# Patient Record
Sex: Female | Born: 2000
Health system: Southern US, Community
[De-identification: ages and names within clinical notes are randomized; demographics above are authoritative.]

## PROBLEM LIST (undated history)

## (undated) ENCOUNTER — Inpatient Hospital Stay (HOSPITAL_COMMUNITY): Payer: Self-pay

## (undated) ENCOUNTER — Emergency Department (HOSPITAL_COMMUNITY): Admission: EM | Payer: Medicaid Other | Source: Home / Self Care

## (undated) DIAGNOSIS — N83209 Unspecified ovarian cyst, unspecified side: Secondary | ICD-10-CM

## (undated) DIAGNOSIS — Z789 Other specified health status: Secondary | ICD-10-CM

## (undated) HISTORY — PX: NO PAST SURGERIES: SHX2092

---
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
## 2018-10-03 ENCOUNTER — Other Ambulatory Visit: Payer: Self-pay

## 2018-10-03 ENCOUNTER — Emergency Department (HOSPITAL_BASED_OUTPATIENT_CLINIC_OR_DEPARTMENT_OTHER)
Admission: EM | Admit: 2018-10-03 | Discharge: 2018-10-03 | Disposition: A | Discharge status: Home / Self Care | Payer: Medicaid Other | Attending: Emergency Medicine | Admitting: Emergency Medicine

## 2018-10-03 ENCOUNTER — Encounter (HOSPITAL_BASED_OUTPATIENT_CLINIC_OR_DEPARTMENT_OTHER): Payer: Self-pay

## 2018-10-03 DIAGNOSIS — R112 Nausea with vomiting, unspecified: Secondary | ICD-10-CM | POA: Diagnosis present

## 2018-10-03 DIAGNOSIS — R1084 Generalized abdominal pain: Secondary | ICD-10-CM | POA: Diagnosis not present

## 2018-10-03 DIAGNOSIS — R197 Diarrhea, unspecified: Secondary | ICD-10-CM | POA: Diagnosis not present

## 2018-10-03 MED ORDER — DICYCLOMINE HCL 10 MG PO CAPS
10.0000 mg | ORAL_CAPSULE | Freq: Once | ORAL | Status: AC
  Administered 2018-10-03: 10 mg via ORAL
  Filled 2018-10-03: qty 1

## 2018-10-03 MED ORDER — DIPHENHYDRAMINE HCL 50 MG/ML IJ SOLN
25.0000 mg | Freq: Once | INTRAMUSCULAR | Status: AC
  Administered 2018-10-03: 25 mg via INTRAMUSCULAR
  Filled 2018-10-03: qty 1

## 2018-10-03 MED ORDER — PROMETHAZINE HCL 25 MG RE SUPP: 25.0000 mg | Freq: Four times a day (QID) | RECTAL | 0 refills | Status: DC | PRN

## 2018-10-03 MED ORDER — PROCHLORPERAZINE EDISYLATE 10 MG/2ML IJ SOLN
10.0000 mg | Freq: Once | INTRAMUSCULAR | Status: AC
Start: 2018-10-03 — End: 2018-10-03
  Administered 2018-10-03: 10 mg via INTRAMUSCULAR
  Filled 2018-10-03: qty 2

## 2018-10-03 MED ORDER — DICYCLOMINE HCL 20 MG PO TABS: 20.0000 mg | ORAL_TABLET | Freq: Two times a day (BID) | ORAL | 0 refills | Status: DC

## 2018-10-03 MED ORDER — ALUM & MAG HYDROXIDE-SIMETH 200-200-20 MG/5ML PO SUSP
15.0000 mL | Freq: Once | ORAL | Status: AC
  Administered 2018-10-03: 15 mL via ORAL
  Filled 2018-10-03: qty 30

## 2018-10-03 NOTE — ED Provider Notes (Signed)
[facility] EMERGENCY DEPARTMENT Provider Note   CSN: [id number] Arrival date & time: 10/03/18  2128     History   Chief Complaint Chief Complaint  Patient presents with  . Abdominal Pain    HPI Veronica Pittman is a 18 y.o. female.  17 yo F with a chief complaint of nausea vomiting diarrhea and abdominal cramping.  This been going on for the past couple days.  The patient's boyfriend had a similar illness.  She is continued to have abdominal cramping and vomiting today.  She went to the emergency room this morning was diagnosed with a likely viral illness and was discharged home.  Since then she is continued to have the abdominal cramping and vomiting.  She does not feel that she is been able to keep much down.  Describes the pain is all over the abdomen.  Nothing seems to make it better or worse.  The history is provided by the patient.  Abdominal Pain   This is a new problem. The current episode started yesterday. The problem occurs constantly. The problem has not changed since onset.The pain is associated with an unknown factor. The pain is located in the generalized abdominal region. The quality of the pain is cramping. The pain is at a severity of 10/10. The pain is severe. Associated symptoms include fever (subjective), diarrhea, nausea and vomiting. Pertinent negatives include dysuria, headaches, arthralgias and myalgias. Nothing aggravates the symptoms. Nothing relieves the symptoms.    History reviewed. No pertinent past medical history.  There are no active problems to display for this patient.   History reviewed. No pertinent surgical history.   OB History    Gravida  1   Para      Term      Preterm      AB      Living        SAB      TAB      Ectopic      Multiple      Live Births               Home Medications    Prior to Admission medications   Medication Sig Start Date End Date Taking? Authorizing Provider  dicyclomine  (BENTYL) 20 MG tablet Take 1 tablet (20 mg total) by mouth 2 (two) times daily. 10/03/18   Melene Plan, DO  promethazine (PHENERGAN) 25 MG suppository Place 1 suppository (25 mg total) rectally every 6 (six) hours as needed for nausea or vomiting. 10/03/18   Melene Plan, DO    Family History No family history on file.  Social History Social History   Tobacco Use  . Smoking status: Never Smoker  . Smokeless tobacco: Never Used  Substance Use Topics  . Alcohol use: No  . Drug use: Never     Allergies   Patient has no known allergies.   Review of Systems Review of Systems  Constitutional: Positive for fever (subjective). Negative for chills.  HENT: Negative for congestion and rhinorrhea.   Eyes: Negative for redness and visual disturbance.  Respiratory: Negative for shortness of breath and wheezing.   Cardiovascular: Negative for chest pain and palpitations.  Gastrointestinal: Positive for abdominal pain, diarrhea, nausea and vomiting.  Genitourinary: Negative for dysuria and urgency.  Musculoskeletal: Negative for arthralgias and myalgias.  Skin: Negative for pallor and wound.  Neurological: Negative for dizziness and headaches.     Physical Exam Updated Vital Signs BP 127/89 (BP Location: Left Arm)  Pulse (!) 55   Temp 98.5 F (36.9 C) (Oral)   Resp 18   Ht 5\' 2"  (1.575 m)   Wt 48.1 kg   LMP 09/05/2018   SpO2 100%   Breastfeeding? Unknown   BMI 19.39 kg/m   Physical Exam  Constitutional: She is oriented to person, place, and time. She appears well-developed and well-nourished. No distress.  HENT:  Head: Normocephalic and atraumatic.  Eyes: Pupils are equal, round, and reactive to light. EOM are normal.  Neck: Normal range of motion. Neck supple.  Cardiovascular: Normal rate and regular rhythm. Exam reveals no gallop and no friction rub.  No murmur heard. Pulmonary/Chest: Effort normal. She has no wheezes. She has no rales.  Abdominal: Soft. She exhibits no  distension. There is tenderness (mild diffuse).  Musculoskeletal: She exhibits no edema or tenderness.  Neurological: She is alert and oriented to person, place, and time.  Skin: Skin is warm and dry. She is not diaphoretic.  Psychiatric: She has a normal mood and affect. Her behavior is normal.  Nursing note and vitals reviewed.    ED Treatments / Results  Labs (all labs ordered are listed, but only abnormal results are displayed) Labs Reviewed - No data to display  EKG None  Radiology No results found.  Procedures Procedures (including critical care time)  Medications Ordered in ED Medications  prochlorperazine (COMPAZINE) injection 10 mg (10 mg Intramuscular Given 10/03/18 2202)  diphenhydrAMINE (BENADRYL) injection 25 mg (25 mg Intramuscular Given 10/03/18 2202)  dicyclomine (BENTYL) capsule 10 mg (10 mg Oral Given 10/03/18 2201)  alum & mag hydroxide-simeth (MAALOX/MYLANTA) 200-200-20 MG/5ML suspension 15 mL (15 mLs Oral Given 10/03/18 2201)     Initial Impression / Assessment and Plan / ED Course  I have reviewed the triage vital signs and the nursing notes.  Pertinent labs & imaging results that were available during my care of the patient were reviewed by me and considered in my medical decision making (see chart for details).     17 yo F with a chief complaint of diffuse abdominal pain.  She has a very benign abdominal exam with mild tenderness everywhere.  She was seen earlier today and I agree with the earlier assessment by that physician seems most likely to be a viral illness especially with a boyfriend having a similar issue.  I do not feel that repeat lab work is required as she is young and healthy with no significant medical issues I feel they are unlikely to be changed, also did not feel that imaging would be beneficial with diffuse pain.  I will treat her symptomatically.  We will have her follow-up with her family doctor.  10:46 PM:  I have discussed the  diagnosis/risks/treatment options with the patient and family and believe the pt to be eligible for discharge home to follow-up with PCP. We also discussed returning to the ED immediately if new or worsening sx occur. We discussed the sx which are most concerning (e.g., sudden worsening pain, fever, inability to tolerate by mouth) that necessitate immediate return. Medications administered to the patient during their visit and any new prescriptions provided to the patient are listed below.  Medications given during this visit Medications  prochlorperazine (COMPAZINE) injection 10 mg (10 mg Intramuscular Given 10/03/18 2202)  diphenhydrAMINE (BENADRYL) injection 25 mg (25 mg Intramuscular Given 10/03/18 2202)  dicyclomine (BENTYL) capsule 10 mg (10 mg Oral Given 10/03/18 2201)  alum & mag hydroxide-simeth (MAALOX/MYLANTA) 200-200-20 MG/5ML suspension 15 mL (15 mLs  Oral Given 10/03/18 2201)      The patient appears reasonably screen and/or stabilized for discharge and I doubt any other medical condition or other [facility] requiring further screening, evaluation, or treatment in the ED at this time prior to discharge.    Final Clinical Impressions(s) / ED Diagnoses   Final diagnoses:  Generalized abdominal pain    ED Discharge Orders         Ordered    promethazine (PHENERGAN) 25 MG suppository  Every 6 hours PRN     10/03/18 2243    dicyclomine (BENTYL) 20 MG tablet  2 times daily     10/03/18 2243           Melene Plan, DO 10/03/18 2246

## 2018-10-03 NOTE — ED Triage Notes (Signed)
C/o abd pain, n/v/d x 2 days-[facility]-rx zofran with no relief and increase in pain-NAD-steady gait

## 2018-10-03 NOTE — Discharge Instructions (Signed)
Follow up with your PCP.  Return for inability to eat or drink, persistent fever >5 days, sudden worsening abdominal pain. ° °

## 2018-10-03 NOTE — ED Notes (Signed)
ED Provider at bedside. 

## 2018-10-09 ENCOUNTER — Emergency Department (HOSPITAL_BASED_OUTPATIENT_CLINIC_OR_DEPARTMENT_OTHER)
Admission: EM | Admit: 2018-10-09 | Discharge: 2018-10-09 | Disposition: A | Discharge status: Home / Self Care | Payer: Medicaid Other | Attending: Emergency Medicine | Admitting: Emergency Medicine

## 2018-10-09 ENCOUNTER — Encounter (HOSPITAL_BASED_OUTPATIENT_CLINIC_OR_DEPARTMENT_OTHER): Payer: Self-pay | Admitting: Emergency Medicine

## 2018-10-09 ENCOUNTER — Emergency Department (HOSPITAL_BASED_OUTPATIENT_CLINIC_OR_DEPARTMENT_OTHER): Payer: Medicaid Other

## 2018-10-09 ENCOUNTER — Other Ambulatory Visit: Payer: Self-pay

## 2018-10-09 DIAGNOSIS — B373 Candidiasis of vulva and vagina: Secondary | ICD-10-CM | POA: Diagnosis not present

## 2018-10-09 DIAGNOSIS — B3731 Acute candidiasis of vulva and vagina: Secondary | ICD-10-CM

## 2018-10-09 DIAGNOSIS — N83292 Other ovarian cyst, left side: Secondary | ICD-10-CM | POA: Insufficient documentation

## 2018-10-09 DIAGNOSIS — Z79899 Other long term (current) drug therapy: Secondary | ICD-10-CM | POA: Insufficient documentation

## 2018-10-09 DIAGNOSIS — N83202 Unspecified ovarian cyst, left side: Secondary | ICD-10-CM

## 2018-10-09 DIAGNOSIS — R1032 Left lower quadrant pain: Secondary | ICD-10-CM | POA: Diagnosis present

## 2018-10-09 LAB — PREGNANCY, URINE: Preg Test, Ur: NEGATIVE

## 2018-10-09 LAB — URINALYSIS, ROUTINE W REFLEX MICROSCOPIC
Glucose, UA: NEGATIVE mg/dL
HGB URINE DIPSTICK: NEGATIVE
Ketones, ur: 40 mg/dL — AB
Leukocytes, UA: NEGATIVE
Nitrite: NEGATIVE
PROTEIN: NEGATIVE mg/dL
Specific Gravity, Urine: 1.025 (ref 1.005–1.030)
pH: 6 (ref 5.0–8.0)

## 2018-10-09 LAB — COMPREHENSIVE METABOLIC PANEL
ALBUMIN: 4.7 g/dL (ref 3.5–5.0)
ALT: 17 U/L (ref 0–44)
AST: 29 U/L (ref 15–41)
Alkaline Phosphatase: 52 U/L (ref 38–126)
Anion gap: 16 — ABNORMAL HIGH (ref 5–15)
BUN: 13 mg/dL (ref 6–20)
CALCIUM: 9.2 mg/dL (ref 8.9–10.3)
CO2: 25 mmol/L (ref 22–32)
Chloride: 92 mmol/L — ABNORMAL LOW (ref 98–111)
Creatinine, Ser: 0.98 mg/dL (ref 0.44–1.00)
GFR calc Af Amer: >60 mL/min (ref >60)
GFR calc non Af Amer: >60 mL/min (ref >60)
GLUCOSE: 91 mg/dL (ref 70–99)
Potassium: 3 mmol/L — ABNORMAL LOW (ref 3.5–5.1)
Sodium: 133 mmol/L — ABNORMAL LOW (ref 135–145)
TOTAL PROTEIN: 8.4 g/dL — AB (ref 6.5–8.1)
Total Bilirubin: 1.9 mg/dL — ABNORMAL HIGH (ref 0.3–1.2)

## 2018-10-09 LAB — WET PREP, GENITAL
CLUE CELLS WET PREP: NONE SEEN
SPERM: NONE SEEN
TRICH WET PREP: NONE SEEN

## 2018-10-09 LAB — CBC
HCT: 51.6 % — ABNORMAL HIGH (ref 36.0–46.0)
Hemoglobin: 17.5 g/dL — ABNORMAL HIGH (ref 12.0–15.0)
MCH: 28.9 pg (ref 26.0–34.0)
MCHC: 33.9 g/dL (ref 30.0–36.0)
MCV: 85.1 fL (ref 80.0–100.0)
Platelets: 264 10*3/uL (ref 150–400)
RBC: 6.06 MIL/uL — AB (ref 3.87–5.11)
RDW: 12.7 % (ref 11.5–15.5)
WBC: 4.3 10*3/uL (ref 4.0–10.5)
nRBC: 0 % (ref 0.0–0.2)

## 2018-10-09 LAB — LIPASE, BLOOD: LIPASE: 44 U/L (ref 11–51)

## 2018-10-09 MED ORDER — FLUCONAZOLE 150 MG PO TABS: 150.0000 mg | ORAL_TABLET | Freq: Every day | ORAL | 0 refills | Status: AC

## 2018-10-09 MED ORDER — IOPAMIDOL (ISOVUE-300) INJECTION 61%
100.0000 mL | Freq: Once | INTRAVENOUS | Status: AC | PRN
  Administered 2018-10-09: 100 mL via INTRAVENOUS

## 2018-10-09 MED ORDER — SODIUM CHLORIDE 0.9 % IV BOLUS
1000.0000 mL | Freq: Once | INTRAVENOUS | Status: AC
  Administered 2018-10-09: 1000 mL via INTRAVENOUS

## 2018-10-09 MED ORDER — DICYCLOMINE HCL 10 MG PO CAPS
10.0000 mg | ORAL_CAPSULE | Freq: Once | ORAL | Status: AC
  Administered 2018-10-09: 10 mg via ORAL
  Filled 2018-10-09: qty 1

## 2018-10-09 MED ORDER — KETOROLAC TROMETHAMINE 15 MG/ML IJ SOLN
15.0000 mg | Freq: Once | INTRAMUSCULAR | Status: AC
  Administered 2018-10-09: 15 mg via INTRAVENOUS
  Filled 2018-10-09: qty 1

## 2018-10-09 MED ORDER — LIDOCAINE HCL (PF) 1 % IJ SOLN
INTRAMUSCULAR | Status: AC
  Administered 2018-10-09: 1.2 mL
  Filled 2018-10-09: qty 5

## 2018-10-09 MED ORDER — ONDANSETRON HCL 4 MG/2ML IJ SOLN
4.0000 mg | Freq: Once | INTRAMUSCULAR | Status: AC
  Administered 2018-10-09: 4 mg via INTRAVENOUS
  Filled 2018-10-09: qty 2

## 2018-10-09 MED ORDER — AZITHROMYCIN 250 MG PO TABS
1000.0000 mg | ORAL_TABLET | Freq: Once | ORAL | Status: AC
  Administered 2018-10-09: 1000 mg via ORAL
  Filled 2018-10-09: qty 4

## 2018-10-09 MED ORDER — POTASSIUM CHLORIDE CRYS ER 20 MEQ PO TBCR
40.0000 meq | EXTENDED_RELEASE_TABLET | Freq: Once | ORAL | Status: AC
  Administered 2018-10-09: 40 meq via ORAL
  Filled 2018-10-09: qty 2

## 2018-10-09 MED ORDER — CEFTRIAXONE SODIUM 250 MG IJ SOLR
250.0000 mg | Freq: Once | INTRAMUSCULAR | Status: AC
  Administered 2018-10-09: 250 mg via INTRAMUSCULAR
  Filled 2018-10-09: qty 250

## 2018-10-09 NOTE — ED Triage Notes (Signed)
LLQ abdominal pain with N/V/D for one week.  Unknown fever but "felt hot"  No chills.

## 2018-10-09 NOTE — ED Provider Notes (Signed)
[facility] EMERGENCY DEPARTMENT Provider Note   CSN: [id number] Arrival date & time: 10/09/18  4098     History   Chief Complaint Chief Complaint  Patient presents with  . Abdominal Pain    HPI Veronica Pittman is a 17 y.o. female.  HPI   Pt is a 17 y/o female with no significant past medical history who presents to the ED today c/o LLQ abd pain that began about 1.5 weeks ago. She states pain has been constant since onset is has been worsening. Rate pain 6/10.  She reports associated nausea, vomiting (>5x/day), and diarrhea (2-3 times per day). She reports subjective fevers, chills, sweats.  Denies dysuria, frequency, urgency, hematuria, vaginal discharge, vaginal bleeding.   Denies ETOH or drug use.   History reviewed. No pertinent past medical history.  There are no active problems to display for this patient.   History reviewed. No pertinent surgical history.   OB History    Gravida  1   Para      Term      Preterm      AB      Living        SAB      TAB      Ectopic      Multiple      Live Births               Home Medications    Prior to Admission medications   Medication Sig Start Date End Date Taking? Authorizing Provider  dicyclomine (BENTYL) 20 MG tablet Take 1 tablet (20 mg total) by mouth 2 (two) times daily. 10/03/18   Melene Plan, DO  fluconazole (DIFLUCAN) 150 MG tablet Take 1 tablet (150 mg total) by mouth daily for 2 days. Take 1 tablet by mouth today.  If you are continuing to have symptoms in the next 72 hours then repeat the dose and take the second tablet. 10/09/18 10/11/18  Kateland Leisinger S, PA-C  promethazine (PHENERGAN) 25 MG suppository Place 1 suppository (25 mg total) rectally every 6 (six) hours as needed for nausea or vomiting. 10/03/18   Melene Plan, DO    Family History No family history on file.  Social History Social History   Tobacco Use  . Smoking status: Never Smoker  . Smokeless tobacco: Never  Used  Substance Use Topics  . Alcohol use: No  . Drug use: Never     Allergies   Patient has no known allergies.   Review of Systems Review of Systems  Constitutional: Negative for chills and fever.  HENT: Negative for congestion, rhinorrhea and sore throat.   Eyes: Negative for visual disturbance.  Respiratory: Negative for cough and shortness of breath.   Cardiovascular: Negative for chest pain.  Gastrointestinal: Positive for abdominal pain, diarrhea, nausea and vomiting. Negative for blood in stool and constipation.  Genitourinary: Negative for dysuria, flank pain, frequency, hematuria and urgency.  Musculoskeletal: Negative for back pain.  Skin: Negative for rash.  Neurological: Negative for headaches.   Physical Exam Updated Vital Signs BP 111/76 (BP Location: Right Arm)   Pulse 72   Temp 98.3 F (36.8 C) (Oral)   Resp 16   Ht 5\' 2"  (1.575 m)   Wt 48 kg   LMP 09/05/2018   SpO2 100%   BMI 19.35 kg/m   Physical Exam  Constitutional: She appears well-developed and well-nourished. No distress.  HENT:  Head: Normocephalic and atraumatic.  Eyes: Conjunctivae are normal. No scleral icterus.  Neck: Neck supple.  Cardiovascular: Normal rate and regular rhythm.  No murmur heard. Pulmonary/Chest: Effort normal and breath sounds normal. No respiratory distress.  Abdominal: Soft.  LUQ and LLQ abd TTP. No rebound, rigidity, or guarding. No CVA TTP.   Genitourinary:  Genitourinary Comments: Exam performed by Karrie Meres,  exam chaperoned Date: 10/09/2018 Pelvic exam: normal external genitalia without evidence of trauma. VULVA: normal appearing vulva with no masses, tenderness or lesion. VAGINA: normal appearing vagina with normal color and discharge, no lesions. CERVIX: normal appearing cervix without lesions, cervical motion tenderness absent, cervical os closed with out purulent discharge; copious vaginal discharge present that is yellow/white.  Wet prep and DNA  probe for chlamydia and GC obtained.   ADNEXA: normal adnexa in size, nontender and no masses UTERUS: uterus is normal size, shape, consistency and nontender.   Musculoskeletal: She exhibits no edema.  Neurological: She is alert.  Skin: Skin is warm and dry. Capillary refill takes less than 2 seconds.  Psychiatric: She has a normal mood and affect.  Nursing note and vitals reviewed.  ED Treatments / Results  Labs (all labs ordered are listed, but only abnormal results are displayed) Labs Reviewed  WET PREP, GENITAL - Abnormal; Notable for the following components:      Result Value   Yeast Wet Prep HPF POC PRESENT (*)    WBC, Wet Prep HPF POC MANY (*)    All other components within normal limits  COMPREHENSIVE METABOLIC PANEL - Abnormal; Notable for the following components:   Sodium 133 (*)    Potassium 3.0 (*)    Chloride 92 (*)    Total Protein 8.4 (*)    Total Bilirubin 1.9 (*)    Anion gap 16 (*)    All other components within normal limits  CBC - Abnormal; Notable for the following components:   RBC 6.06 (*)    Hemoglobin 17.5 (*)    HCT 51.6 (*)    All other components within normal limits  URINALYSIS, ROUTINE W REFLEX MICROSCOPIC - Abnormal; Notable for the following components:   Bilirubin Urine MODERATE (*)    Ketones, ur 40 (*)    All other components within normal limits  LIPASE, BLOOD  PREGNANCY, URINE  GC/CHLAMYDIA PROBE AMP ([place]) NOT AT [facility]    EKG None  Radiology US Transvaginal Non-ob  Result Date: 10/09/2018 CLINICAL DATA:  Left lower quadrant pain for 1 week. CT demonstrating ruptured ovarian cyst. EXAM: TRANSABDOMINAL AND TRANSVAGINAL ULTRASOUND OF PELVIS DOPPLER ULTRASOUND OF OVARIES TECHNIQUE: Both transabdominal and transvaginal ultrasound examinations of the pelvis were performed. Transabdominal technique was performed for global imaging of the pelvis including uterus, ovaries, adnexal regions, and pelvic cul-de-sac. It was necessary to  proceed with endovaginal exam following the transabdominal exam to visualize the uterus, ovaries, and adnexa. Color and duplex Doppler ultrasound was utilized to evaluate blood flow to the ovaries. COMPARISON:  CT of earlier today FINDINGS: Uterus Measurements: 8.2 x 4.2 x 4.8 cm = volume: 87 mL. No fibroids or other mass visualized. Endometrium Thickness: Normal, 9 mm.  No focal abnormality visualized. Right ovary Measurements: 3.9 x 3.4 x 3.1 cm = volume: 21 mL. Normal appearance/no adnexal mass. Left ovary Measurements: 6.3 x 3.2 x 4.1 cm = volume: 42 mL. 3.6 x 2.6 x 3.3 cm avascular lesion within is consistent with a hemorrhagic cyst. Pulsed Doppler evaluation of both ovaries demonstrates normal low-resistance arterial and venous waveforms. Other findings Small amount of free pelvic fluid. IMPRESSION:  1. Left ovarian hemorrhagic cyst, as on CT. 2. No evidence of ovarian or adnexal torsion. Electronically Signed   By: Jeronimo Greaves M.D.   On: 10/09/2018 17:31   US Pelvis Complete  Result Date: 10/09/2018 CLINICAL DATA:  Left lower quadrant pain for 1 week. CT demonstrating ruptured ovarian cyst. EXAM: TRANSABDOMINAL AND TRANSVAGINAL ULTRASOUND OF PELVIS DOPPLER ULTRASOUND OF OVARIES TECHNIQUE: Both transabdominal and transvaginal ultrasound examinations of the pelvis were performed. Transabdominal technique was performed for global imaging of the pelvis including uterus, ovaries, adnexal regions, and pelvic cul-de-sac. It was necessary to proceed with endovaginal exam following the transabdominal exam to visualize the uterus, ovaries, and adnexa. Color and duplex Doppler ultrasound was utilized to evaluate blood flow to the ovaries. COMPARISON:  CT of earlier today FINDINGS: Uterus Measurements: 8.2 x 4.2 x 4.8 cm = volume: 87 mL. No fibroids or other mass visualized. Endometrium Thickness: Normal, 9 mm.  No focal abnormality visualized. Right ovary Measurements: 3.9 x 3.4 x 3.1 cm = volume: 21 mL. Normal  appearance/no adnexal mass. Left ovary Measurements: 6.3 x 3.2 x 4.1 cm = volume: 42 mL. 3.6 x 2.6 x 3.3 cm avascular lesion within is consistent with a hemorrhagic cyst. Pulsed Doppler evaluation of both ovaries demonstrates normal low-resistance arterial and venous waveforms. Other findings Small amount of free pelvic fluid. IMPRESSION: 1. Left ovarian hemorrhagic cyst, as on CT. 2. No evidence of ovarian or adnexal torsion. Electronically Signed   By: Jeronimo Greaves M.D.   On: 10/09/2018 17:31   Ct Abdomen Pelvis W Contrast  Result Date: 10/09/2018 CLINICAL DATA:  LEFT lower quadrant pain, nausea, vomiting and diarrhea for 1 week. EXAM: CT ABDOMEN AND PELVIS WITH CONTRAST TECHNIQUE: Multidetector CT imaging of the abdomen and pelvis was performed using the standard protocol following bolus administration of intravenous contrast. CONTRAST:  ISOVUE-300 IOPAMIDOL (ISOVUE-300) INJECTION 61% COMPARISON:  None. FINDINGS: LOWER CHEST: Lung bases are clear. Included heart size is normal. No pericardial effusion. HEPATOBILIARY: Liver and gallbladder are normal. PANCREAS: Normal. SPLEEN: Normal. ADRENALS/URINARY TRACT: Kidneys are orthotopic, demonstrating symmetric enhancement. No nephrolithiasis, hydronephrosis or solid renal masses. The unopacified ureters are normal in course and caliber. Urinary bladder is partially distended and unremarkable. Normal adrenal glands. STOMACH/BOWEL: Mild fluid and debris distended stomach. The small and large bowel are normal in course and caliber without inflammatory changes. Normal appendix. VASCULAR/LYMPHATIC: Aortoiliac vessels are normal in course and caliber. No lymphadenopathy by CT size criteria. REPRODUCTIVE: 3.5 cm anechoic LEFT adnexal cyst with capsular density and crescentic surrounding density measuring to 7 mm in transaxial dimension. OTHER: Small volume free fluid in the pelvis. No focal fluid collections or intraperitoneal free air. MUSCULOSKELETAL: Nonacute.  IMPRESSION: 1. 3.5 cm ruptured LEFT adnexal cyst with small amount of surrounding blood products. Electronically Signed   By: Awilda Metro M.D.   On: 10/09/2018 14:49   [place] Art/ven Flow Abd Pelv Doppler  Result Date: 10/09/2018 CLINICAL DATA:  Left lower quadrant pain for 1 week. CT demonstrating ruptured ovarian cyst. EXAM: TRANSABDOMINAL AND TRANSVAGINAL ULTRASOUND OF PELVIS DOPPLER ULTRASOUND OF OVARIES TECHNIQUE: Both transabdominal and transvaginal ultrasound examinations of the pelvis were performed. Transabdominal technique was performed for global imaging of the pelvis including uterus, ovaries, adnexal regions, and pelvic cul-de-sac. It was necessary to proceed with endovaginal exam following the transabdominal exam to visualize the uterus, ovaries, and adnexa. Color and duplex Doppler ultrasound was utilized to evaluate blood flow to the ovaries. COMPARISON:  CT of earlier today FINDINGS: Uterus  Measurements: 8.2 x 4.2 x 4.8 cm = volume: 87 mL. No fibroids or other mass visualized. Endometrium Thickness: Normal, 9 mm.  No focal abnormality visualized. Right ovary Measurements: 3.9 x 3.4 x 3.1 cm = volume: 21 mL. Normal appearance/no adnexal mass. Left ovary Measurements: 6.3 x 3.2 x 4.1 cm = volume: 42 mL. 3.6 x 2.6 x 3.3 cm avascular lesion within is consistent with a hemorrhagic cyst. Pulsed Doppler evaluation of both ovaries demonstrates normal low-resistance arterial and venous waveforms. Other findings Small amount of free pelvic fluid. IMPRESSION: 1. Left ovarian hemorrhagic cyst, as on CT. 2. No evidence of ovarian or adnexal torsion. Electronically Signed   By: Jeronimo Greaves M.D.   On: 10/09/2018 17:31    Procedures Procedures (including critical care time)  Medications Ordered in ED Medications  potassium chloride SA (K-DUR,KLOR-CON) CR tablet 40 mEq (40 mEq Oral Given 10/09/18 1146)  ondansetron (ZOFRAN) injection 4 mg (4 mg Intravenous Given 10/09/18 1146)  sodium chloride 0.9  % bolus 1,000 mL (0 mLs Intravenous Stopped 10/09/18 1354)  dicyclomine (BENTYL) capsule 10 mg (10 mg Oral Given 10/09/18 1146)  ketorolac (TORADOL) 15 MG/ML injection 15 mg (15 mg Intravenous Given 10/09/18 1303)  iopamidol (ISOVUE-300) 61 % injection 100 mL (100 mLs Intravenous Contrast Given 10/09/18 1422)  cefTRIAXone (ROCEPHIN) injection 250 mg (250 mg Intramuscular Given 10/09/18 1741)  azithromycin (ZITHROMAX) tablet 1,000 mg (1,000 mg Oral Given 10/09/18 1741)  lidocaine (PF) (XYLOCAINE) 1 % injection (1.2 mLs  Given 10/09/18 1741)     Initial Impression / Assessment and Plan / ED Course  I have reviewed the triage vital signs and the nursing notes.  Pertinent labs & imaging results that were available during my care of the patient were reviewed by me and considered in my medical decision making (see chart for details).     Final Clinical Impressions(s) / ED Diagnoses   Final diagnoses:  Hemorrhagic cyst of left ovary  Vaginal candidiasis   Pt presenting with LUQ and LLQ abd pain associated with NVD. VSS. Afebrile. Has some LUQ and LLQ TTP on exam. No rigidity, rebound or guarding.   Cbc without leukocytosis. hgb elevated, may be due to hemoconcentration in setting of dehydration from NVD. CMP with mild hyponatremia and hypokalemia. Normal kidney and liver function. Lipase WNL. UA with ketonuria and bilirubinuria, no nitrites or leukocytes to suggest urinary tract infection.  On reevaluation after antiemetics, Bentyl and Toradol, patient is crying and states that her pain is not resolved and is actually worse than when she arrived.  Will obtain imaging with CT scan of the abdomen and pelvis.  CT abd/pelvis with ruptured left adnexal cyst. After discussion of these findings with pt, she now admits that she has been having unprotected intercourse with her partner who is not monogamous with her and that she does have concern for STDs. Will complete pelvic exam and ultrasound.    Significant discharge noted on pelvic exam. No CMT. No adnexal TTP bilaterally. Will provide prophylactic treatment with azithromycin and ceftriaxone. Wet prep with yeast present.  Will give Rx for fluconazole.  Ultrasound with left ovarian hemorrhagic cyst as noted on CT scan.  No evidence of ovarian or adnexal torsion.  Have advised patient to take) inflammatories for her symptoms.  As above we will also give Rx for yeast infection.  Have advised her to follow-up with her OB/GYN, she states she actually has an appointment with them next week.  Have advised her to return to the  ER for any new or worsening symptoms.  She voices understanding the plan reasons return.  All questions answered.  ED Discharge Orders         Ordered    fluconazole (DIFLUCAN) 150 MG tablet  Daily     10/09/18 [address]1822           Demontrae Gilbert S, [place]-C 10/09/18 1942    Tegeler, Canary Brimhristopher J, MD 10/10/18 [phone]

## 2018-10-09 NOTE — Discharge Instructions (Addendum)
Please take the medication as directed on your discharge paperwork.  Please follow up with your OB/GYN as scheduled next week for follow-up in regards to the cyst that was found in your ovary today.  If you have heavy vaginal bleeding, lightheadedness, fevers, chills or any new or worsening symptoms then please return to the emergency department immediately.

## 2018-10-10 LAB — GC/CHLAMYDIA PROBE AMP (~~LOC~~) NOT AT ARMC
CHLAMYDIA, DNA PROBE: NEGATIVE
NEISSERIA GONORRHEA: NEGATIVE

## 2018-10-12 ENCOUNTER — Emergency Department (HOSPITAL_BASED_OUTPATIENT_CLINIC_OR_DEPARTMENT_OTHER)
Admission: EM | Admit: 2018-10-12 | Discharge: 2018-10-12 | Disposition: A | Discharge status: Home / Self Care | Payer: Medicaid Other | Attending: Emergency Medicine | Admitting: Emergency Medicine

## 2018-10-12 ENCOUNTER — Encounter (HOSPITAL_BASED_OUTPATIENT_CLINIC_OR_DEPARTMENT_OTHER): Payer: Self-pay | Admitting: *Deleted

## 2018-10-12 ENCOUNTER — Other Ambulatory Visit: Payer: Self-pay

## 2018-10-12 DIAGNOSIS — R1032 Left lower quadrant pain: Secondary | ICD-10-CM | POA: Diagnosis present

## 2018-10-12 DIAGNOSIS — N83202 Unspecified ovarian cyst, left side: Secondary | ICD-10-CM | POA: Diagnosis not present

## 2018-10-12 DIAGNOSIS — N83209 Unspecified ovarian cyst, unspecified side: Secondary | ICD-10-CM

## 2018-10-12 MED ORDER — ONDANSETRON 4 MG PO TBDP: 4.0000 mg | ORAL_TABLET | Freq: Three times a day (TID) | ORAL | 0 refills | Status: DC | PRN

## 2018-10-12 MED ORDER — ONDANSETRON 4 MG PO TBDP
4.0000 mg | ORAL_TABLET | Freq: Once | ORAL | Status: AC
  Administered 2018-10-12: 4 mg via ORAL
  Filled 2018-10-12: qty 1

## 2018-10-12 MED ORDER — KETOROLAC TROMETHAMINE 30 MG/ML IJ SOLN
30.0000 mg | Freq: Once | INTRAMUSCULAR | Status: AC
  Administered 2018-10-12: 30 mg via INTRAMUSCULAR
  Filled 2018-10-12: qty 1

## 2018-10-12 MED ORDER — KETOROLAC TROMETHAMINE 10 MG PO TABS: 20.0000 mg | ORAL_TABLET | Freq: Four times a day (QID) | ORAL | 0 refills | Status: DC | PRN

## 2018-10-12 MED FILL — KETOROLAC 10 MG TABLET: 10 | 2 days supply | Qty: 20 | Fill #0

## 2018-10-12 MED FILL — ONDANSETRON ODT 4 MG TABLET: 4 | 6 days supply | Qty: 20 | Fill #0

## 2018-10-12 NOTE — Discharge Instructions (Addendum)
Take Toradol every 6 hours as needed for pain. You can take Zofran every 8 hours as needed for nausea. You can apply heat to your abdomen for some pain relief. Attend your appointment with your gynecologist.

## 2018-10-12 NOTE — ED Provider Notes (Signed)
[facility] EMERGENCY DEPARTMENT Provider Note   CSN: [id number] Arrival date & time: 10/12/18  1009     History   Chief Complaint Chief Complaint  Patient presents with  . Abdominal Pain    HPI Veronica Pittman is a 17 y.o. female with recent diagnosis of left ruptured ovarian cyst, presenting to the emergency department for subsequent visit regarding left lower quadrant abdominal pain.  Patient was evaluated days ago, with CT scan and pelvic ultrasound showing evidence of left ruptured ovarian cyst.  No other acute pathology.  She had labs done are reassuring; CBC without anemia, without evidence of infection.  GC chlamydia triggers were negative.  Wet prep positive for yeast, and patient treated with Diflucan and prophylactically with azithromycin and Rocephin.  Patient was discharged with symptom medic management including NSAIDs and instructed to follow-up with her OB/GYN.  Patient states she has an appointment on Saturday.  She states since that time the pain has been slightly worsening, described as sharp.  She has not taken any medications for her pain.  Reports associated nausea.  She denies any new symptoms, no fever.  The history is provided by the patient and medical records.    History reviewed. No pertinent past medical history.  There are no active problems to display for this patient.   History reviewed. No pertinent surgical history.   OB History    Gravida  1   Para      Term      Preterm      AB      Living        SAB      TAB      Ectopic      Multiple      Live Births               Home Medications    Prior to Admission medications   Medication Sig Start Date End Date Taking? Authorizing Provider  dicyclomine (BENTYL) 20 MG tablet Take 1 tablet (20 mg total) by mouth 2 (two) times daily. 10/03/18   Melene Plan, DO  ketorolac (TORADOL) 10 MG tablet Take 2 tablets (20 mg total) by mouth every 6 (six) hours as needed. 10/12/18    , [place] N, PA-C  ondansetron (ZOFRAN ODT) 4 MG disintegrating tablet Take 1 tablet (4 mg total) by mouth every 8 (eight) hours as needed for nausea or vomiting. 10/12/18   , [place] N, PA-C  promethazine (PHENERGAN) 25 MG suppository Place 1 suppository (25 mg total) rectally every 6 (six) hours as needed for nausea or vomiting. 10/03/18   Melene Plan, DO    Family History History reviewed. No pertinent family history.  Social History Social History   Tobacco Use  . Smoking status: Never Smoker  . Smokeless tobacco: Never Used  Substance Use Topics  . Alcohol use: No  . Drug use: Never     Allergies   Patient has no known allergies.   Review of Systems Review of Systems  Constitutional: Negative for fever.  Gastrointestinal: Positive for abdominal pain and nausea.  Genitourinary: Negative for vaginal bleeding and vaginal discharge.  All other systems reviewed and are negative.    Physical Exam Updated Vital Signs BP 115/74 (BP Location: Left Arm)   Pulse 89   Temp 98.2 F (36.8 C) (Oral)   Resp 16   Ht 5\' 2"  (1.575 m)   Wt 46.8 kg   LMP 09/07/2018   SpO2 100%  BMI 18.87 kg/m   Physical Exam  Constitutional: She appears well-developed and well-nourished. She does not appear ill. No distress.  HENT:  Head: Normocephalic and atraumatic.  Eyes: Conjunctivae are normal.  Cardiovascular: Normal rate, regular rhythm and normal heart sounds.  Pulmonary/Chest: Effort normal and breath sounds normal. No respiratory distress.  Abdominal: Soft. Normal appearance and bowel sounds are normal. She exhibits no distension and no mass. There is tenderness in the left lower quadrant. There is no rebound and no guarding.  Neurological: She is alert.  Skin: Skin is warm.  Psychiatric: She has a normal mood and affect. Her behavior is normal.  Nursing note and vitals reviewed.    ED Treatments / Results  Labs (all labs ordered are listed, but only abnormal  results are displayed) Labs Reviewed - No data to display  EKG None  Radiology No results found.  Procedures Procedures (including critical care time)  Medications Ordered in ED Medications  ketorolac (TORADOL) 30 MG/ML injection 30 mg (30 mg Intramuscular Given 10/12/18 1123)  ondansetron (ZOFRAN-ODT) disintegrating tablet 4 mg (4 mg Oral Given 10/12/18 1123)     Initial Impression / Assessment and Plan / ED Course  I have reviewed the triage vital signs and the nursing notes.  Pertinent labs & imaging results that were available during my care of the patient were reviewed by me and considered in my medical decision making (see chart for details).     Patient presenting for subsequent visit after recent diagnosis of ruptured left ovarian cyst 3 days ago.  She had reassuring lab work.  Negative STD cultures though treated prophylactically.  No new symptoms, patient reports persistent left lower quadrant abdominal pain.  She has not been treating her pain at home.  She has a GYN appointment in 2 days.  On exam, her abdomen is soft with some tenderness to the left lower quadrant.  No guarding or rebound.  She is well-appearing and not in distress.  Signs are normal.  Due to not believe repeat lab work or imaging is indicated at this time on presentation and exam findings.  Pain treated in the ED with Toradol.  Will send with prescription for pain medication and antiemetics.  Patient is agreeable to this plan.  Instructed to attend her appointment with GYN.  Well-appearing and safe for discharge.  Discussed results, findings, treatment and follow up. Patient advised of return precautions. Patient verbalized understanding and agreed with plan.  Final Clinical Impressions(s) / ED Diagnoses   Final diagnoses:  Ruptured ovarian cyst    ED Discharge Orders         Ordered    ketorolac (TORADOL) 10 MG tablet  Every 6 hours PRN     10/12/18 1128    ondansetron (ZOFRAN ODT) 4 MG  disintegrating tablet  Every 8 hours PRN     10/12/18 1128           , [place] N, [place]C 10/12/18 1129    Little, Ambrose Finlandachel Morgan, MD 10/13/18 [id number]

## 2020-06-23 IMAGING — US US ART/VEN ABD/PELV/SCROTUM DOPPLER LTD
1 series · 13 of 25 positions shown · non-contrast
Comparison: CT of earlier today

CLINICAL DATA: Left lower quadrant pain for 1 week. CT
demonstrating ruptured ovarian cyst.

EXAM:
TRANSABDOMINAL AND TRANSVAGINAL ULTRASOUND OF PELVIS
DOPPLER ULTRASOUND OF OVARIES
TECHNIQUE: Both transabdominal and transvaginal ultrasound examinations of the
pelvis were performed. Transabdominal technique was performed for
global imaging of the pelvis including uterus, ovaries, adnexal
regions, and pelvic cul-de-sac.
It was necessary to proceed with endovaginal exam following the
transabdominal exam to visualize the uterus, ovaries, and adnexa.
Color and duplex Doppler ultrasound was utilized to evaluate blood
flow to the ovaries.

[Series 1: us art/ven abd/pelv/scrotum doppler ltd · 0.17mm/px · 13 of 61 slices shown]
[im 1/61]
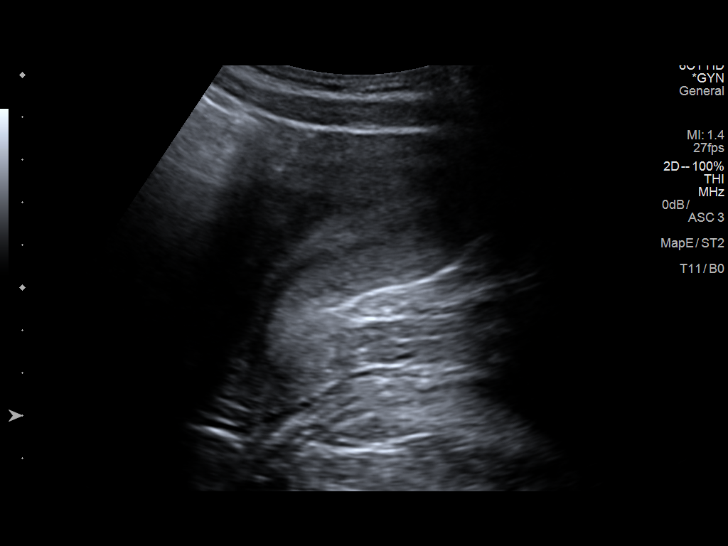
[im 6/61]
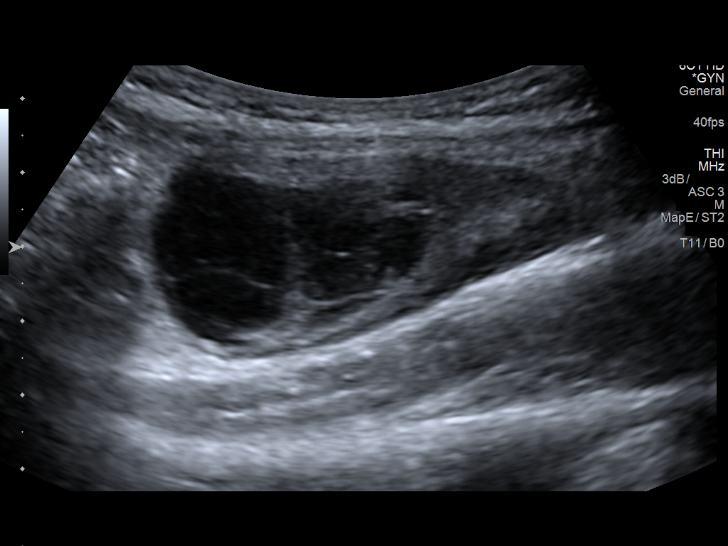
[im 11/61]
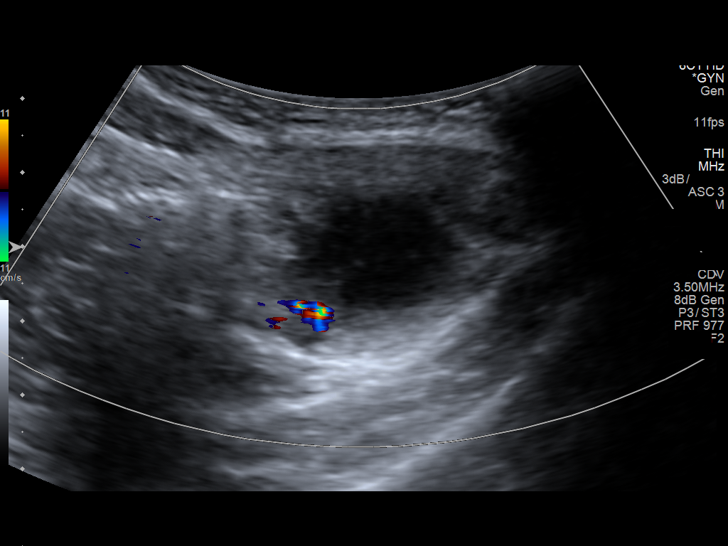
[im 16/61]
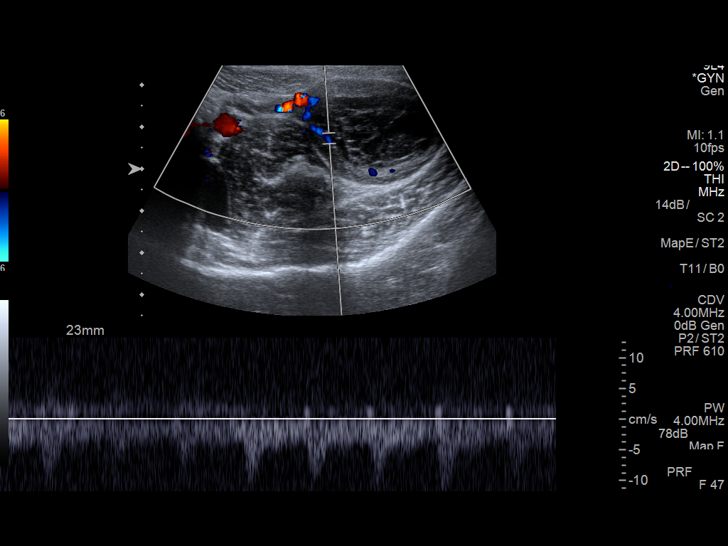
[im 21/61]
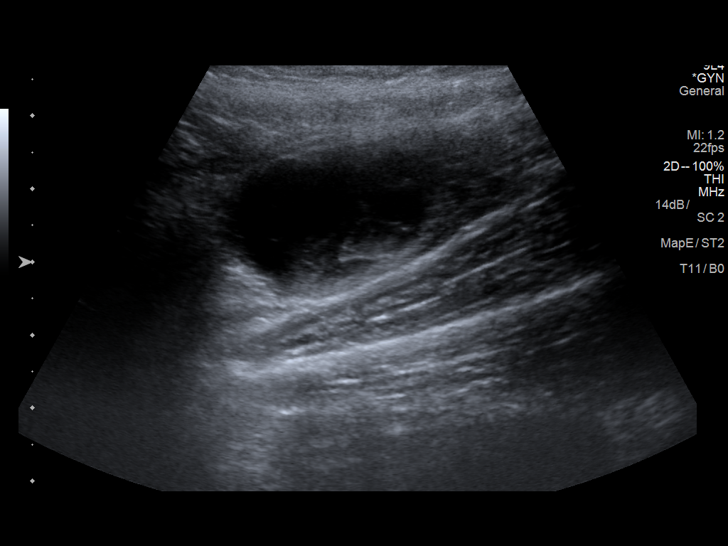
[im 26/61]
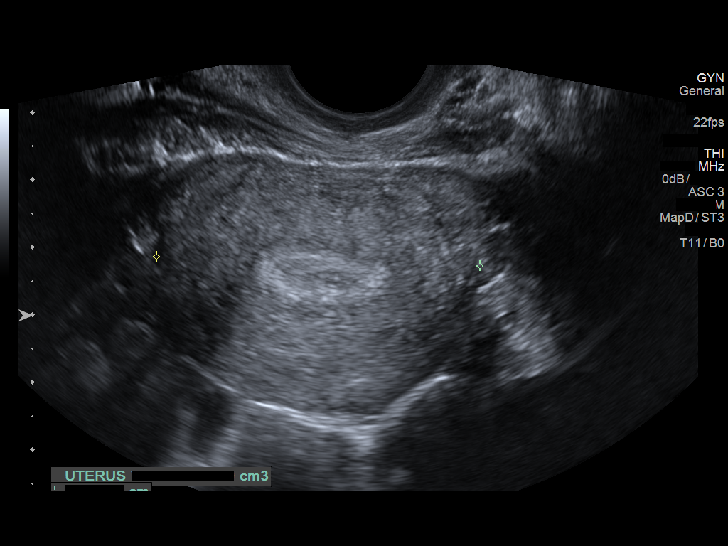
[im 31/61]
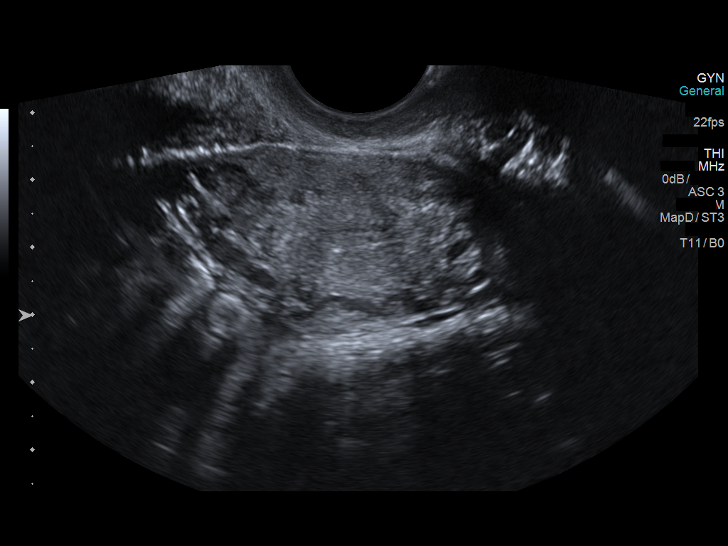
[im 36/61]
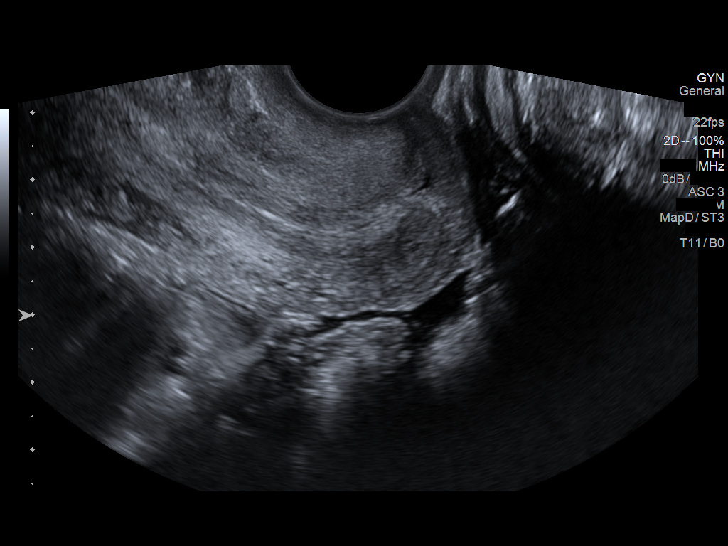
[im 41/61]
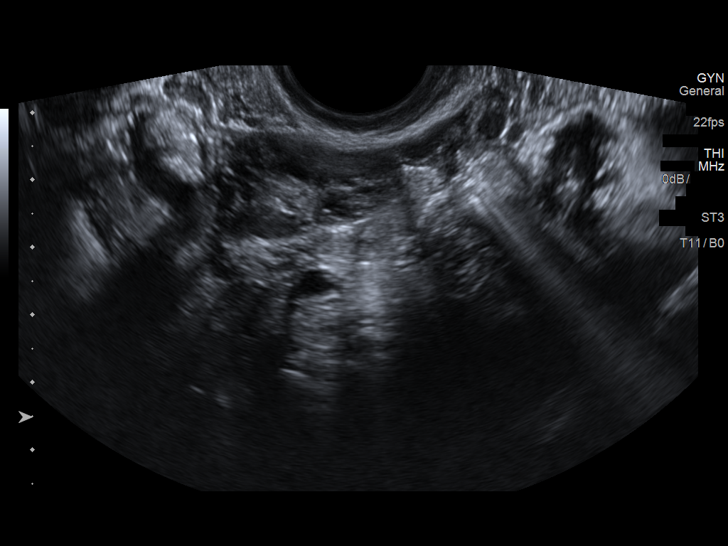
[im 46/61]
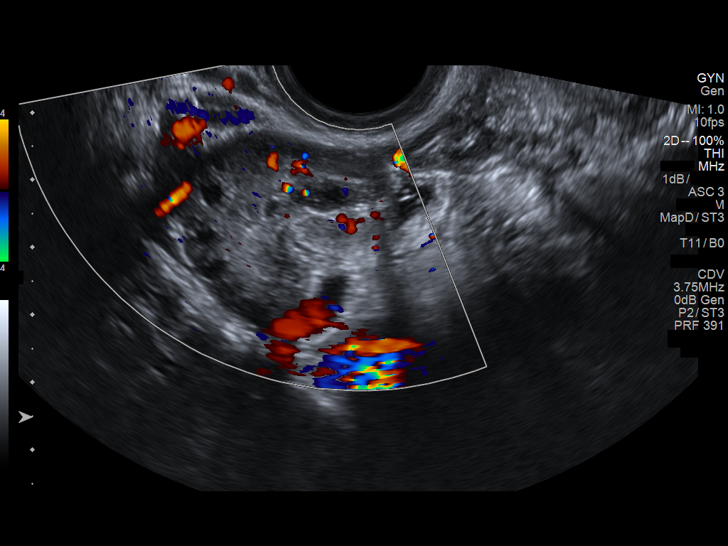
[im 51/61]
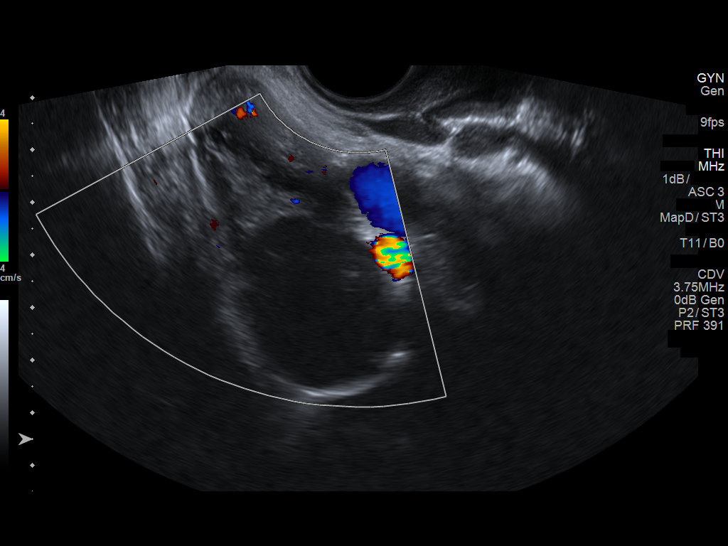
[im 56/61]
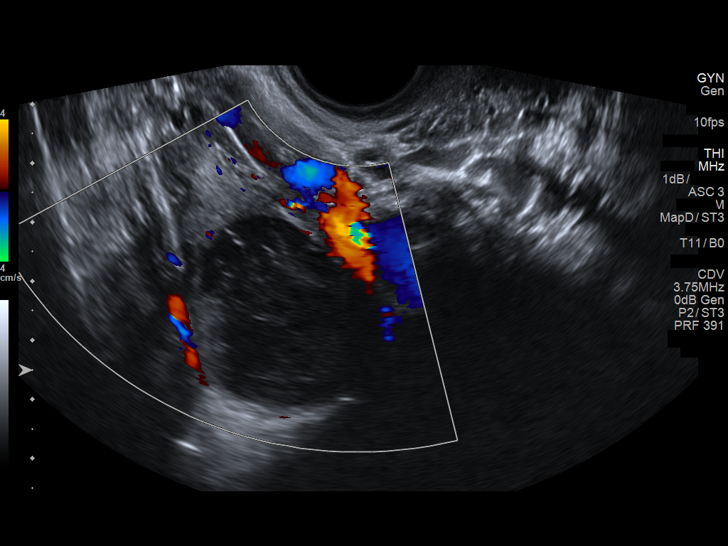
[im 61/61]
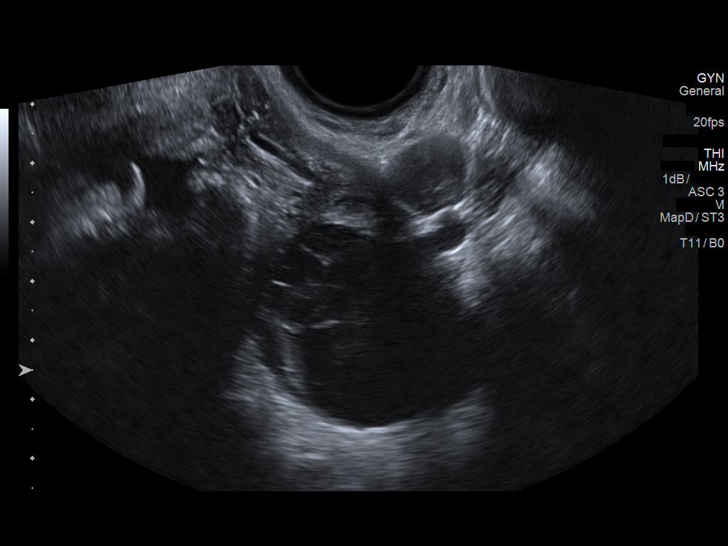

[13 of 25 positions shown; findings below may reference images not displayed]

FINDINGS: Uterus

Measurements: 8.2 x 4.2 x 4.8 cm = volume: 87 mL. No fibroids or
other mass visualized.

Endometrium

Thickness: Normal, 9 mm..  No focal abnormality visualized.

Right ovary

Measurements: 3.9 x 3.4 x 3.1 cm = volume: 21 mL. Normal
appearance/no adnexal mass.

Left ovary

Measurements: 6.3 x 3.2 x 4.1 cm = volume: 42 mL. 3.6 x 2.6 x 3.3 cm
avascular lesion within is consistent with a hemorrhagic cyst.

Pulsed Doppler evaluation of both ovaries demonstrates normal
low-resistance arterial and venous waveforms.

Other findings

Small amount of free pelvic fluid.
IMPRESSION: 1. Left ovarian hemorrhagic cyst, as on CT.
2. No evidence of ovarian or adnexal torsion.

## 2021-06-26 ENCOUNTER — Encounter (HOSPITAL_BASED_OUTPATIENT_CLINIC_OR_DEPARTMENT_OTHER): Payer: Self-pay | Admitting: *Deleted

## 2021-06-26 ENCOUNTER — Emergency Department (HOSPITAL_BASED_OUTPATIENT_CLINIC_OR_DEPARTMENT_OTHER)
Admission: EM | Admit: 2021-06-26 | Discharge: 2021-06-26 | Disposition: A | Discharge status: Home / Self Care | Payer: Medicaid Other | Attending: Emergency Medicine | Admitting: Emergency Medicine

## 2021-06-26 ENCOUNTER — Other Ambulatory Visit: Payer: Self-pay

## 2021-06-26 DIAGNOSIS — H9202 Otalgia, left ear: Secondary | ICD-10-CM | POA: Diagnosis present

## 2021-06-26 DIAGNOSIS — H60502 Unspecified acute noninfective otitis externa, left ear: Secondary | ICD-10-CM | POA: Diagnosis not present

## 2021-06-26 LAB — PREGNANCY, URINE: Preg Test, Ur: NEGATIVE

## 2021-06-26 MED ORDER — OFLOXACIN 0.3 % OT SOLN: 5.0000 [drp] | Freq: Two times a day (BID) | OTIC | 0 refills | Status: DC

## 2021-06-26 NOTE — ED Provider Notes (Signed)
[facility] EMERGENCY DEPARTMENT Provider Note   CSN: [id number] Arrival date & time: 06/26/21  1933     History Chief Complaint  Patient presents with   Otalgia    Veronica Pittman is a 20 y.o. female with no significant past medical history presents to the ED due to left otalgia that started earlier today.  Denies any drainage.  No fever or chills.  No hearing loss.  Patient denies recent swimming.  No trauma to ear.  Patient denies sore throat, cough, nasal congestion, neck pain. No treatment prior to arrival.   History obtained from patient and past medical records. No interpreter used during encounter.       History reviewed. No pertinent past medical history.  There are no problems to display for this patient.   History reviewed. No pertinent surgical history.   OB History     Gravida  1   Para      Term      Preterm      AB      Living         SAB      IAB      Ectopic      Multiple      Live Births              No family history on file.  Social History   Tobacco Use   Smoking status: Never   Smokeless tobacco: Never  Vaping Use   Vaping Use: Never used  Substance Use Topics   Alcohol use: No   Drug use: Never    Home Medications Prior to Admission medications   Medication Sig Start Date End Date Taking? Authorizing Provider  ofloxacin (FLOXIN) 0.3 % OTIC solution Place 5 drops into the left ear 2 (two) times daily. 06/26/21  Yes Aryanne Gilleland, Merla Riches, PA-C  dicyclomine (BENTYL) 20 MG tablet Take 1 tablet (20 mg total) by mouth 2 (two) times daily. 10/03/18   Melene Plan, DO  ketorolac (TORADOL) 10 MG tablet Take 2 tablets (20 mg total) by mouth every 6 (six) hours as needed. 10/12/18   Robinson, [place] N, PA-C  ondansetron (ZOFRAN ODT) 4 MG disintegrating tablet Take 1 tablet (4 mg total) by mouth every 8 (eight) hours as needed for nausea or vomiting. 10/12/18   Robinson, [place] N, PA-C  promethazine (PHENERGAN) 25 MG  suppository Place 1 suppository (25 mg total) rectally every 6 (six) hours as needed for nausea or vomiting. 10/03/18   Melene Plan, DO    Allergies    Patient has no known allergies.  Review of Systems   Review of Systems  Constitutional:  Negative for chills and fever.  HENT:  Positive for ear pain. Negative for congestion, ear discharge and sore throat.   All other systems reviewed and are negative.  Physical Exam Updated Vital Signs BP 115/84 (BP Location: Right Arm)   Pulse 96   Temp 98.6 F (37 C) (Oral)   Resp 20   Ht 5\' 1"  (1.549 m)   Wt 63.4 kg   LMP 05/22/2021   SpO2 97%   BMI 26.41 kg/m   Physical Exam Vitals and nursing note reviewed.  Constitutional:      General: She is not in acute distress.    Appearance: She is not ill-appearing.  HENT:     Head: Normocephalic.     Right Ear: Tympanic membrane normal.     Left Ear: There is impacted cerumen.  Ears:     Comments: Erythematous left your canal.  Cerumen impaction.  Unable to visualize left TM. Normal right TM. Eyes:     Pupils: Pupils are equal, round, and reactive to light.  Cardiovascular:     Rate and Rhythm: Normal rate and regular rhythm.     Pulses: Normal pulses.     Heart sounds: Normal heart sounds. No murmur heard.   No friction rub. No gallop.  Pulmonary:     Effort: Pulmonary effort is normal.     Breath sounds: Normal breath sounds.  Abdominal:     General: Abdomen is flat. There is no distension.     Palpations: Abdomen is soft.     Tenderness: There is no abdominal tenderness. There is no guarding or rebound.  Musculoskeletal:        General: Normal range of motion.     Cervical back: Neck supple.  Skin:    General: Skin is warm and dry.  Neurological:     General: No focal deficit present.     Mental Status: She is alert.  Psychiatric:        Mood and Affect: Mood normal.        Behavior: Behavior normal.    ED Results / Procedures / Treatments   Labs (all labs ordered  are listed, but only abnormal results are displayed) Labs Reviewed  PREGNANCY, URINE    EKG None  Radiology No results found.  Procedures Procedures   Medications Ordered in ED Medications - No data to display  ED Course  I have reviewed the triage vital signs and the nursing notes.  Pertinent labs & imaging results that were available during my care of the patient were reviewed by me and considered in my medical decision making (see chart for details).    MDM Rules/Calculators/A&P                          20 year old female presents to the ED due to left otalgia that started earlier today.  No hearing loss, fever/chills, sore throat, or cough.  No recent swimming.  On arrival, stable vitals.  Patient in no acute distress.  Left ear canal erythematous.  Cerumen impaction. Disimpaction performed. Left TM normal. No signs of AOM. No perforation. Suspect symptoms relate to otitis externa.  No mastoid tenderness to suggest mastoiditis.  No meningismus to suggest meningitis.  Patient discharged with ofloxacin solution.  Advised patient to follow-up with PCP symptoms not improved within the next week. Strict ED precautions discussed with patient. Patient states understanding and agrees to plan. Patient discharged home in no acute distress and stable vitals  Final Clinical Impression(s) / ED Diagnoses Final diagnoses:  Acute otitis externa of left ear, unspecified type    Rx / DC Orders ED Discharge Orders          Ordered    ofloxacin (FLOXIN) 0.3 % OTIC solution  2 times daily        06/26/21 2238             Jesusita Oka 06/26/21 2248    Arby Barrette, MD 07/03/21 1538

## 2021-06-26 NOTE — ED Notes (Signed)
Ear irrigation performed.  Cerumen able to be removed.

## 2021-06-26 NOTE — ED Triage Notes (Signed)
Left ear pain since this am

## 2021-06-26 NOTE — Discharge Instructions (Signed)
It was a pleasure taking care of you today. As discussed, it appears you have an infection of the ear canal. I am sending you home with ear drops. Use daily for the next 10 days. Follow-up with PCP if symptoms do not improve within the next week. Return to the ER for new or worsening symptoms.

## 2022-01-27 ENCOUNTER — Other Ambulatory Visit: Payer: Self-pay

## 2022-01-27 ENCOUNTER — Emergency Department (HOSPITAL_BASED_OUTPATIENT_CLINIC_OR_DEPARTMENT_OTHER): Payer: Medicaid Other

## 2022-01-27 ENCOUNTER — Emergency Department (HOSPITAL_BASED_OUTPATIENT_CLINIC_OR_DEPARTMENT_OTHER)
Admission: EM | Admit: 2022-01-27 | Discharge: 2022-01-27 | Disposition: A | Discharge status: Home / Self Care | Payer: Medicaid Other | Attending: Emergency Medicine | Admitting: Emergency Medicine

## 2022-01-27 ENCOUNTER — Encounter (HOSPITAL_BASED_OUTPATIENT_CLINIC_OR_DEPARTMENT_OTHER): Payer: Self-pay | Admitting: Emergency Medicine

## 2022-01-27 DIAGNOSIS — R1032 Left lower quadrant pain: Secondary | ICD-10-CM | POA: Diagnosis present

## 2022-01-27 DIAGNOSIS — N3000 Acute cystitis without hematuria: Secondary | ICD-10-CM | POA: Insufficient documentation

## 2022-01-27 DIAGNOSIS — E876 Hypokalemia: Secondary | ICD-10-CM | POA: Insufficient documentation

## 2022-01-27 DIAGNOSIS — R102 Pelvic and perineal pain: Secondary | ICD-10-CM

## 2022-01-27 DIAGNOSIS — R112 Nausea with vomiting, unspecified: Secondary | ICD-10-CM

## 2022-01-27 LAB — COMPREHENSIVE METABOLIC PANEL
ALT: 29 U/L (ref 0–44)
AST: 24 U/L (ref 15–41)
Albumin: 4.1 g/dL (ref 3.5–5.0)
Alkaline Phosphatase: 47 U/L (ref 38–126)
Anion gap: 10 (ref 5–15)
BUN: 17 mg/dL (ref 6–20)
CO2: 25 mmol/L (ref 22–32)
Calcium: 8.7 mg/dL — ABNORMAL LOW (ref 8.9–10.3)
Chloride: 104 mmol/L (ref 98–111)
Creatinine, Ser: 0.79 mg/dL (ref 0.44–1.00)
GFR, Estimated: >60 mL/min (ref >60)
Glucose, Bld: 120 mg/dL — ABNORMAL HIGH (ref 70–99)
Potassium: 3.1 mmol/L — ABNORMAL LOW (ref 3.5–5.1)
Sodium: 139 mmol/L (ref 135–145)
Total Bilirubin: 0.9 mg/dL (ref 0.3–1.2)
Total Protein: 7.3 g/dL (ref 6.5–8.1)

## 2022-01-27 LAB — LIPASE, BLOOD: Lipase: 28 U/L (ref 11–51)

## 2022-01-27 LAB — URINALYSIS, ROUTINE W REFLEX MICROSCOPIC
Glucose, UA: NEGATIVE mg/dL
Hgb urine dipstick: NEGATIVE
Ketones, ur: >=80 mg/dL — AB
Leukocytes,Ua: NEGATIVE
Nitrite: NEGATIVE
Protein, ur: 30 mg/dL — AB
Specific Gravity, Urine: >1.03 — ABNORMAL HIGH (ref 1.005–1.030)
pH: 5.5 (ref 5.0–8.0)

## 2022-01-27 LAB — URINALYSIS, MICROSCOPIC (REFLEX)

## 2022-01-27 LAB — CBC
HCT: 40.9 % (ref 36.0–46.0)
Hemoglobin: 14.3 g/dL (ref 12.0–15.0)
MCH: 31.2 pg (ref 26.0–34.0)
MCHC: 35 g/dL (ref 30.0–36.0)
MCV: 89.1 fL (ref 80.0–100.0)
Platelets: 224 10*3/uL (ref 150–400)
RBC: 4.59 MIL/uL (ref 3.87–5.11)
RDW: 13 % (ref 11.5–15.5)
WBC: 8.3 10*3/uL (ref 4.0–10.5)
nRBC: 0 % (ref 0.0–0.2)

## 2022-01-27 LAB — PREGNANCY, URINE: Preg Test, Ur: NEGATIVE

## 2022-01-27 MED ORDER — FENTANYL CITRATE PF 50 MCG/ML IJ SOSY
25.0000 ug | PREFILLED_SYRINGE | Freq: Once | INTRAMUSCULAR | Status: AC
  Administered 2022-01-27: 25 ug via INTRAVENOUS
  Filled 2022-01-27: qty 1

## 2022-01-27 MED ORDER — HYDROMORPHONE HCL 1 MG/ML IJ SOLN
1.0000 mg | Freq: Once | INTRAMUSCULAR | Status: AC
  Administered 2022-01-27: 1 mg via INTRAVENOUS
  Filled 2022-01-27: qty 1

## 2022-01-27 MED ORDER — ONDANSETRON HCL 4 MG/2ML IJ SOLN
4.0000 mg | Freq: Once | INTRAMUSCULAR | Status: AC
  Administered 2022-01-27: 4 mg via INTRAVENOUS
  Filled 2022-01-27: qty 2

## 2022-01-27 MED ORDER — SODIUM CHLORIDE 0.9 % IV SOLN
1.0000 g | Freq: Once | INTRAVENOUS | Status: AC
  Administered 2022-01-27: 1 g via INTRAVENOUS
  Filled 2022-01-27: qty 10

## 2022-01-27 MED ORDER — KETOROLAC TROMETHAMINE 15 MG/ML IJ SOLN
15.0000 mg | Freq: Once | INTRAMUSCULAR | Status: AC
  Administered 2022-01-27: 15 mg via INTRAVENOUS
  Filled 2022-01-27: qty 1

## 2022-01-27 NOTE — ED Triage Notes (Signed)
LLQ pain x 4 days , vomiting . No diarrhea . Denies urinary symptoms ?

## 2022-01-27 NOTE — Discharge Instructions (Addendum)
You were seen in the emergency department today for abdominal pain. ? ?As we discussed your lab work and ultrasound today all looked reassuring.  We gave you a dose of antibiotics here, and I'd like you to continue your prescribed antibiotics.  I recommend taking your nausea medicine 10 to 15 minutes before your antibiotic and see if this helps with your nausea while taking the other medicine. ? ?I'd like you to continue your current antibiotic regimen.  As we gave you a dose here, you can wait and take your next dose tonight.  It is important that you finish the entire course of this.  You can use ibuprofen or Tylenol as needed for pain.  I think it is reasonable to take this on a more regular schedule, and cycling the 2 for the next several days. ? ?We have also sent your urine for culture, you should receive a call if your antibiotic needs to be changed. ? ?Continue to monitor how you are doing and return to the emergency department for any new or worsening symptoms. ?

## 2022-01-27 NOTE — ED Notes (Signed)
Stopped pt by the restroom prior to triage to obtain urine sample, she sts unable to void at that time  ?

## 2022-01-27 NOTE — ED Provider Notes (Signed)
[facility] EMERGENCY DEPARTMENT Provider Note   CSN: [id number] Arrival date & time: 01/27/22  0854     History  Chief Complaint  Patient presents with   Abdominal Pain    Veronica Pittman is a 21 y.o. female who presents the emergency department with 3 days of abdominal pain.  Patient was seen at an outside facility on 3/6 and was diagnosed with a urinary tract infection.  She was given Macrobid and Zofran and discharged in stable condition.  She states that she has been taking both of these medications, but has had worsening pain and persistent vomiting.  She believes that the Macrobid has been making her vomit, and the Zofran is helping her like it was.  She denies fevers, chills, vaginal bleeding or discharge, back pain, or diarrhea.  Unsure if she could be pregnant.   Abdominal Pain Associated symptoms: nausea and vomiting   Associated symptoms: no chills, no constipation, no diarrhea, no dysuria, no fever, no hematuria, no vaginal bleeding and no vaginal discharge       Home Medications Prior to Admission medications   Medication Sig Start Date End Date Taking? Authorizing Provider  dicyclomine (BENTYL) 20 MG tablet Take 1 tablet (20 mg total) by mouth 2 (two) times daily. 10/03/18   Melene PlanFloyd, Dan, DO  ketorolac (TORADOL) 10 MG tablet Take 2 tablets (20 mg total) by mouth every 6 (six) hours as needed. 10/12/18   Robinson, [place] N, PA-C  ofloxacin (FLOXIN) 0.3 % OTIC solution Place 5 drops into the left ear 2 (two) times daily. 06/26/21   Mannie StabileAberman, Caroline C, PA-C  ondansetron (ZOFRAN ODT) 4 MG disintegrating tablet Take 1 tablet (4 mg total) by mouth every 8 (eight) hours as needed for nausea or vomiting. 10/12/18   Robinson, [place] N, PA-C  promethazine (PHENERGAN) 25 MG suppository Place 1 suppository (25 mg total) rectally every 6 (six) hours as needed for nausea or vomiting. 10/03/18   Melene PlanFloyd, Dan, DO      Allergies    Patient has no known allergies.    Review of  Systems   Review of Systems  Constitutional:  Negative for chills and fever.  Gastrointestinal:  Positive for abdominal pain, nausea and vomiting. Negative for constipation and diarrhea.  Genitourinary:  Negative for dysuria, flank pain, frequency, hematuria, urgency, vaginal bleeding and vaginal discharge.  Musculoskeletal:  Negative for back pain.  All other systems reviewed and are negative.  Physical Exam Updated Vital Signs BP (!) 102/58    Pulse (!) 59    Temp 97.6 F (36.4 C)    Resp 18    Wt 62.6 kg    LMP 12/30/2021    SpO2 100%    BMI 26.07 kg/m  Physical Exam Vitals and nursing note reviewed.  Constitutional:      Appearance: Normal appearance.  HENT:     Head: Normocephalic and atraumatic.  Eyes:     Conjunctiva/sclera: Conjunctivae normal.  Cardiovascular:     Rate and Rhythm: Normal rate and regular rhythm.  Pulmonary:     Effort: Pulmonary effort is normal. No respiratory distress.     Breath sounds: Normal breath sounds.  Abdominal:     General: There is no distension.     Palpations: Abdomen is soft.     Tenderness: There is abdominal tenderness in the suprapubic area and left lower quadrant. There is no right CVA tenderness, left CVA tenderness, guarding or rebound.  Skin:    General: Skin is warm and  dry.  Neurological:     General: No focal deficit present.     Mental Status: She is alert.    ED Results / Procedures / Treatments   Labs (all labs ordered are listed, but only abnormal results are displayed) Labs Reviewed  COMPREHENSIVE METABOLIC PANEL - Abnormal; Notable for the following components:      Result Value   Potassium 3.1 (*)    Glucose, Bld 120 (*)    Calcium 8.7 (*)    All other components within normal limits  URINALYSIS, ROUTINE W REFLEX MICROSCOPIC - Abnormal; Notable for the following components:   Color, Urine AMBER (*)    APPearance CLOUDY (*)    Specific Gravity, Urine >1.030 (*)    Bilirubin Urine SMALL (*)    Ketones, ur  >=80 (*)    Protein, ur 30 (*)    All other components within normal limits  URINALYSIS, MICROSCOPIC (REFLEX) - Abnormal; Notable for the following components:   Bacteria, UA MANY (*)    All other components within normal limits  URINE CULTURE  LIPASE, BLOOD  CBC  PREGNANCY, URINE    EKG None  Radiology US PELVIC COMPLETE W TRANSVAGINAL AND TORSION R/O  Result Date: 01/27/2022 CLINICAL DATA:  Left lower quadrant abdominal pain and nausea and vomiting. EXAM: TRANSABDOMINAL AND TRANSVAGINAL ULTRASOUND OF PELVIS DOPPLER ULTRASOUND OF OVARIES TECHNIQUE: Both transabdominal and transvaginal ultrasound examinations of the pelvis were performed. Transabdominal technique was performed for global imaging of the pelvis including uterus, ovaries, adnexal regions, and pelvic cul-de-sac. It was necessary to proceed with endovaginal exam following the transabdominal exam to visualize the adnexa. Color and duplex Doppler ultrasound was utilized to evaluate blood flow to the ovaries. COMPARISON:  October 12, 2018 FINDINGS: Uterus Measurements: 8.8 x 4.9 x 5.7 cm = volume: 128 mL. No fibroids or other mass visualized. Endometrium Thickness: 9.6 mm.  No focal abnormality visualized. Right ovary Measurements: 4.1 x 2.3 x 3.3 cm = volume: 16 mL. Normal appearance/no adnexal mass. Left ovary Measurements: 3.6 x 1.6 x 2.6 cm = volume: 7.8 mL. Normal appearance/no adnexal mass. Pulsed Doppler evaluation of both ovaries demonstrates normal low-resistance arterial and venous waveforms. Other findings Trace free fluid. IMPRESSION: Trace free fluid, with uncertain clinical significance, otherwise normal pelvic ultrasound. Electronically Signed   By: Ted Mcalpine M.D.   On: 01/27/2022 11:58    Procedures Procedures    Medications Ordered in ED Medications  fentaNYL (SUBLIMAZE) injection 25 mcg (25 mcg Intravenous Given 01/27/22 0940)  ondansetron (ZOFRAN) injection 4 mg (4 mg Intravenous Given 01/27/22 0942)   ketorolac (TORADOL) 15 MG/ML injection 15 mg (15 mg Intravenous Given 01/27/22 1052)  HYDROmorphone (DILAUDID) injection 1 mg (1 mg Intravenous Given 01/27/22 1050)  cefTRIAXone (ROCEPHIN) 1 g in sodium chloride 0.9 % 100 mL IVPB (0 g Intravenous Stopped 01/27/22 1124)    ED Course/ Medical Decision Making/ A&P Clinical Course as of 01/27/22 1427  Wed Jan 27, 2022  1040 This is a 21 year old female presenting to ED with lower abdominal pain, diffusely, ongoing for about 3 to 4 days.  She was seen in the ER 2 days ago and diagnosed with a UTI based on a urinalysis, started on antibiotics but reports that she has been vomiting unable to keep them down.  She reports severe pain in her lower abdomen which is generally colicky.  She feels that her urine is more concentrated has a history of UTIs, and is not certain whether she is having dysuria  or similar symptoms at this time.  She denies any history of abdominal surgeries.  She denies any vaginal discharge or bleeding.  Here in the ED the patient is uncomfortable on exam, uncomfortable in the stretcher, she has diffuse mild lower abdominal tenderness mostly suprapubic, no focal tenderness at McBurney's point.  Vital signs within normal limits.  She is mildly hypokalemic with a potassium of 3.1.  She is pending a repeat UA.  Pregnancy test was -2 days ago.  I have ordered IV Rocephin to cover empirically for possible UTI, will also order a torsion ultrasound to rule out ovarian torsion.  Lower suspicion for acute appendicitis at this time with no fever, no leukocytosis, no right lower quadrant tenderness on exam. [MT]    Clinical Course User Index [MT] Trifan, Kermit Balo, MD                           Medical Decision Making Amount and/or Complexity of Data Reviewed Labs: ordered. Radiology: ordered. ECG/medicine tests: ordered.  Risk Prescription drug management.   This patient presents to the ED for concern of abdominal pain, this involves an  extensive number of treatment options, and is a complaint that carries with it a high risk of complications and morbidity. The emergent differential diagnosis prior to evaluation includes, but is not limited to,  AAA, mesenteric ischemia, appendicitis, diverticulitis, DKA, gastritis, gastroenteritis, nephrolithiasis, pancreatitis, peritonitis, adrenal insufficiency, intestinal ischemia, constipation, UTI, SBO/LBO, splenic rupture, biliary disease, IBD, IBS, PUD, or hepatitis, ectopic pregnancy, ovarian torsion, PID.  This is not an exhaustive differential.   Additional history: Additional history obtained from chart review. External records from outside source obtained and reviewed including ED note from outside facility.  Physical Exam: Physical exam performed. The pertinent findings include: patient appears very uncomfortable. Suprapubic tenderness to palpation. No guarding or rigidity.   Lab Tests: I ordered, and personally interpreted labs.  The pertinent results include:  No leukocytosis, normal hemoglobin. Hypokalemia of 3.1, electrolytes otherwise within normal limits. Lipase 28. Urinalysis negative for nitrites or leukocytes, but this was after receiving full dose of antibiotics and fluids.    Imaging Studies: I ordered imaging studies including ultrasound to rule out ovarian torsion. I independently visualized and interpreted imaging which showed normal adnexa, no torsion. I agree with the radiologist interpretation.   Medications: I ordered medication including zofran, analgesics, and empiric antibiotics for abdominal pain and nausea. Reevaluation of the patient after these medicines showed that the patient improved. I have reviewed the patients home medicines and have made adjustments as needed.  Disposition: After consideration of the diagnostic results and the patients response to treatment, I feel that patient's not requiring admission or inpatient treatment for her symptoms.  We  have ruled out other acute etiologies for her pain today such as ovarian torsion or ectopic pregnancy, and I think that her pain is likely related to her persistent urinary tract infection.  Urinalysis today did not show signs of infection, however this was after receiving empiric antibiotics.  Advised the patient that I like her to finish her prescribed course of antibiotics.  We also talked about taking over-the-counter medications as needed for pain, and staying well-hydrated.  We discussed reasons to return to the emergency department, and patient is agreeable to the plan.  Final Clinical Impression(s) / ED Diagnoses Final diagnoses:  Suprapubic pain  Acute cystitis without hematuria    Rx / DC Orders ED Discharge Orders  None      Portions of this report may have been transcribed using voice recognition software. Every effort was made to ensure accuracy; however, inadvertent computerized transcription errors may be present.    Jeanella Flattery 01/27/22 1429    Terald Sleeper, MD 01/27/22 1807

## 2022-01-27 NOTE — ED Notes (Addendum)
Went into patients room to obtain vitals and patient stated she tried to urinate but is unable to.  ?

## 2022-01-28 ENCOUNTER — Encounter (HOSPITAL_BASED_OUTPATIENT_CLINIC_OR_DEPARTMENT_OTHER): Payer: Self-pay | Admitting: Emergency Medicine

## 2022-01-28 ENCOUNTER — Emergency Department (HOSPITAL_BASED_OUTPATIENT_CLINIC_OR_DEPARTMENT_OTHER): Payer: Medicaid Other

## 2022-01-28 ENCOUNTER — Other Ambulatory Visit: Payer: Self-pay

## 2022-01-28 ENCOUNTER — Emergency Department (HOSPITAL_BASED_OUTPATIENT_CLINIC_OR_DEPARTMENT_OTHER)
Admission: EM | Admit: 2022-01-28 | Discharge: 2022-01-28 | Disposition: A | Discharge status: Home / Self Care | Payer: Medicaid Other | Attending: Emergency Medicine | Admitting: Emergency Medicine

## 2022-01-28 DIAGNOSIS — R112 Nausea with vomiting, unspecified: Secondary | ICD-10-CM | POA: Diagnosis not present

## 2022-01-28 DIAGNOSIS — R1084 Generalized abdominal pain: Secondary | ICD-10-CM | POA: Diagnosis not present

## 2022-01-28 DIAGNOSIS — E876 Hypokalemia: Secondary | ICD-10-CM | POA: Diagnosis not present

## 2022-01-28 DIAGNOSIS — R109 Unspecified abdominal pain: Secondary | ICD-10-CM | POA: Diagnosis present

## 2022-01-28 LAB — COMPREHENSIVE METABOLIC PANEL
ALT: 27 U/L (ref 0–44)
AST: 32 U/L (ref 15–41)
Albumin: 4.2 g/dL (ref 3.5–5.0)
Alkaline Phosphatase: 47 U/L (ref 38–126)
Anion gap: 10 (ref 5–15)
BUN: 15 mg/dL (ref 6–20)
CO2: 25 mmol/L (ref 22–32)
Calcium: 8.5 mg/dL — ABNORMAL LOW (ref 8.9–10.3)
Chloride: 101 mmol/L (ref 98–111)
Creatinine, Ser: 0.74 mg/dL (ref 0.44–1.00)
GFR, Estimated: >60 mL/min (ref >60)
Glucose, Bld: 120 mg/dL — ABNORMAL HIGH (ref 70–99)
Potassium: 3.1 mmol/L — ABNORMAL LOW (ref 3.5–5.1)
Sodium: 136 mmol/L (ref 135–145)
Total Bilirubin: 1.1 mg/dL (ref 0.3–1.2)
Total Protein: 7.2 g/dL (ref 6.5–8.1)

## 2022-01-28 LAB — CBC WITH DIFFERENTIAL/PLATELET
Abs Immature Granulocytes: 0.03 10*3/uL (ref 0.00–0.07)
Basophils Absolute: 0 10*3/uL (ref 0.0–0.1)
Basophils Relative: 1 %
Eosinophils Absolute: 0 10*3/uL (ref 0.0–0.5)
Eosinophils Relative: 0 %
HCT: 39.6 % (ref 36.0–46.0)
Hemoglobin: 13.9 g/dL (ref 12.0–15.0)
Immature Granulocytes: 0 %
Lymphocytes Relative: 21 %
Lymphs Abs: 1.8 10*3/uL (ref 0.7–4.0)
MCH: 30.7 pg (ref 26.0–34.0)
MCHC: 35.1 g/dL (ref 30.0–36.0)
MCV: 87.4 fL (ref 80.0–100.0)
Monocytes Absolute: 0.5 10*3/uL (ref 0.1–1.0)
Monocytes Relative: 6 %
Neutro Abs: 6.2 10*3/uL (ref 1.7–7.7)
Neutrophils Relative %: 72 %
Platelets: 221 10*3/uL (ref 150–400)
RBC: 4.53 MIL/uL (ref 3.87–5.11)
RDW: 12.7 % (ref 11.5–15.5)
WBC: 8.5 10*3/uL (ref 4.0–10.5)
nRBC: 0 % (ref 0.0–0.2)

## 2022-01-28 LAB — WET PREP, GENITAL
Sperm: NONE SEEN
Trich, Wet Prep: NONE SEEN
WBC, Wet Prep HPF POC: <10 (ref <10)
Yeast Wet Prep HPF POC: NONE SEEN

## 2022-01-28 LAB — HIV ANTIBODY (ROUTINE TESTING W REFLEX): HIV Screen 4th Generation wRfx: NONREACTIVE

## 2022-01-28 LAB — LIPASE, BLOOD: Lipase: 27 U/L (ref 11–51)

## 2022-01-28 MED ORDER — POTASSIUM CHLORIDE CRYS ER 20 MEQ PO TBCR
40.0000 meq | EXTENDED_RELEASE_TABLET | Freq: Once | ORAL | Status: AC
  Administered 2022-01-28: 22:00:00 40 meq via ORAL
  Filled 2022-01-28: qty 2

## 2022-01-28 MED ORDER — PROMETHAZINE HCL 25 MG RE SUPP: 25.0000 mg | Freq: Three times a day (TID) | RECTAL | 0 refills | Status: DC | PRN

## 2022-01-28 MED ORDER — ONDANSETRON 4 MG PO TBDP
4.0000 mg | ORAL_TABLET | Freq: Once | ORAL | Status: AC
  Administered 2022-01-28: 19:00:00 4 mg via ORAL
  Filled 2022-01-28: qty 1

## 2022-01-28 MED ORDER — IOHEXOL 300 MG/ML  SOLN
100.0000 mL | Freq: Once | INTRAMUSCULAR | Status: AC | PRN
  Administered 2022-01-28: 22:00:00 100 mL via INTRAVENOUS

## 2022-01-28 MED ORDER — MORPHINE SULFATE (PF) 4 MG/ML IV SOLN
4.0000 mg | Freq: Once | INTRAVENOUS | Status: AC
  Administered 2022-01-28: 22:00:00 4 mg via INTRAVENOUS
  Filled 2022-01-28: qty 1

## 2022-01-28 MED ORDER — KETOROLAC TROMETHAMINE 15 MG/ML IJ SOLN
15.0000 mg | Freq: Once | INTRAMUSCULAR | Status: AC
  Administered 2022-01-28: 21:00:00 15 mg via INTRAVENOUS
  Filled 2022-01-28: qty 1

## 2022-01-28 MED ORDER — SODIUM CHLORIDE 0.9 % IV BOLUS
1000.0000 mL | Freq: Once | INTRAVENOUS | Status: AC
  Administered 2022-01-28: 21:00:00 1000 mL via INTRAVENOUS

## 2022-01-28 MED ORDER — ONDANSETRON HCL 4 MG/2ML IJ SOLN
4.0000 mg | Freq: Once | INTRAMUSCULAR | Status: AC
  Administered 2022-01-28: 21:00:00 4 mg via INTRAVENOUS
  Filled 2022-01-28: qty 2

## 2022-01-28 MED ORDER — OXYCODONE-ACETAMINOPHEN 5-325 MG PO TABS
1.0000 | ORAL_TABLET | ORAL | Status: DC | PRN
  Administered 2022-01-28: 19:00:00 1 via ORAL
  Filled 2022-01-28: qty 1

## 2022-01-28 MED ORDER — DICYCLOMINE HCL 20 MG PO TABS: 20.0000 mg | ORAL_TABLET | Freq: Two times a day (BID) | ORAL | 0 refills | Status: DC

## 2022-01-28 NOTE — ED Provider Notes (Signed)
[facility] EMERGENCY DEPARTMENT Provider Note   CSN: [id number] Arrival date & time: 01/28/22  1802     History  Chief Complaint  Patient presents with   Abdominal Pain    Veronica Pittman is a 21 y.o. female.  HPI  21 year old female presenting the emergency department today for evaluation of abdominal pain ongoing for the last 4 to 5 days.  She has been seen multiple times in the emergency department here and at at outside hospital for her symptoms.  She was seen yesterday and had a pelvic ultrasound that was reassuring.  Her labs are also reassuring at that time.  She has been started on antibiotics for urinary tract infection.  She states she has continued to have nausea and vomiting and abdominal pain since then.  She notes patient is located to the left upper quadrant of the abdomen.  Denies alcohol use or heavy NSAID use.  She denies any associated diarrhea, constipation, vaginal discharge, bleeding or urinary symptoms.  Home Medications Prior to Admission medications   Medication Sig Start Date End Date Taking? Authorizing Provider  dicyclomine (BENTYL) 20 MG tablet Take 1 tablet (20 mg total) by mouth 2 (two) times daily. 10/03/18   Melene PlanFloyd, Dan, DO  ketorolac (TORADOL) 10 MG tablet Take 2 tablets (20 mg total) by mouth every 6 (six) hours as needed. 10/12/18   Robinson, [place] N, PA-C  ofloxacin (FLOXIN) 0.3 % OTIC solution Place 5 drops into the left ear 2 (two) times daily. 06/26/21   Mannie StabileAberman, Caroline C, PA-C  ondansetron (ZOFRAN ODT) 4 MG disintegrating tablet Take 1 tablet (4 mg total) by mouth every 8 (eight) hours as needed for nausea or vomiting. 10/12/18   Robinson, [place] N, PA-C  promethazine (PHENERGAN) 25 MG suppository Place 1 suppository (25 mg total) rectally every 6 (six) hours as needed for nausea or vomiting. 10/03/18   Melene PlanFloyd, Dan, DO      Allergies    Patient has no known allergies.    Review of Systems   Review of Systems See HPI for pertinent  positives or negatives.   Physical Exam Updated Vital Signs BP 120/80    Pulse 72    Temp 98 F (36.7 C) (Oral)    Resp 16    Ht 5\' 1"  (1.549 m)    Wt 62.6 kg    LMP 12/30/2021    SpO2 96%    BMI 26.07 kg/m  Physical Exam Vitals and nursing note reviewed.  Constitutional:      General: She is not in acute distress.    Appearance: She is well-developed.     Comments: Pt groaning in bed  HENT:     Head: Normocephalic and atraumatic.  Eyes:     Conjunctiva/sclera: Conjunctivae normal.  Cardiovascular:     Rate and Rhythm: Normal rate and regular rhythm.     Heart sounds: No murmur heard. Pulmonary:     Effort: Pulmonary effort is normal. No respiratory distress.     Breath sounds: Normal breath sounds.  Abdominal:     General: Bowel sounds are normal.     Palpations: Abdomen is soft.     Tenderness: There is no abdominal tenderness. There is no guarding or rebound.  Genitourinary:    Comments: Exam performed by Karrie Meresortni S Hershall Benkert,  exam chaperoned Date: 01/28/2022 Pelvic exam: normal external genitalia without evidence of trauma. VULVA: normal appearing vulva with no masses, tenderness or lesion. VAGINA: normal appearing vagina with normal color and discharge,  no lesions. CERVIX: normal appearing cervix without lesions, cervical motion tenderness absent, cervical os closed with out purulent discharge; vaginal discharge - white and copious, Wet prep and DNA probe for chlamydia and GC obtained.   ADNEXA: normal adnexa in size, nontender and no masses UTERUS: uterus is normal size, shape, consistency and nontender.  Musculoskeletal:        General: No swelling.     Cervical back: Neck supple.  Skin:    General: Skin is warm and dry.     Capillary Refill: Capillary refill takes less than 2 seconds.  Neurological:     Mental Status: She is alert.  Psychiatric:        Mood and Affect: Mood normal.    ED Results / Procedures / Treatments   Labs (all labs ordered are listed, but  only abnormal results are displayed) Labs Reviewed  WET PREP, GENITAL - Abnormal; Notable for the following components:      Result Value   Clue Cells Wet Prep HPF POC PRESENT (*)    All other components within normal limits  COMPREHENSIVE METABOLIC PANEL - Abnormal; Notable for the following components:   Potassium 3.1 (*)    Glucose, Bld 120 (*)    Calcium 8.5 (*)    All other components within normal limits  CBC WITH DIFFERENTIAL/PLATELET  LIPASE, BLOOD  HIV ANTIBODY (ROUTINE TESTING W REFLEX)  RPR  GC/CHLAMYDIA PROBE AMP ([place]) NOT AT [facility]    EKG None  Radiology CT ABDOMEN PELVIS W CONTRAST  Result Date: 01/28/2022 CLINICAL DATA:  Left lower quadrant abdominal pain. EXAM: CT ABDOMEN AND PELVIS WITH CONTRAST TECHNIQUE: Multidetector CT imaging of the abdomen and pelvis was performed using the standard protocol following bolus administration of intravenous contrast. RADIATION DOSE REDUCTION: This exam was performed according to the departmental dose-optimization program which includes automated exposure control, adjustment of the mA and/or kV according to patient size and/or use of iterative reconstruction technique. CONTRAST:  OMNIPAQUE IOHEXOL 300 MG/ML  SOLN COMPARISON:  CT abdomen and pelvis 10/09/2018. Pelvic ultrasound 01/27/2022. FINDINGS: Lower chest: No acute abnormality. Hepatobiliary: No focal liver abnormality is seen. No gallstones, gallbladder wall thickening, or biliary dilatation. Pancreas: Unremarkable. No pancreatic ductal dilatation or surrounding inflammatory changes. Spleen: Normal in size without focal abnormality. Adrenals/Urinary Tract: Adrenal glands are unremarkable. Kidneys are normal, without renal calculi, focal lesion, or hydronephrosis. Bladder is unremarkable. Stomach/Bowel: Stomach is within normal limits. Appendix appears normal. No bowel obstruction or free air. There is rectosigmoid colon wall thickening versus normal under distension.  Vascular/Lymphatic: No significant vascular findings are present. No enlarged abdominal or pelvic lymph nodes. Reproductive: There is a collapsing cyst or follicle in the right ovary measuring 1.9 cm. Otherwise, the ovaries and uterus appear within normal limits. Other: There is a small amount of simple free fluid in the right adnexa. There is no focal abdominal wall hernia. Musculoskeletal: No acute or significant osseous findings. IMPRESSION: 1. Collapsing cyst or follicle in the right ovary measuring 1.9 cm with small amount of free fluid in the right adnexa. 2. Rectosigmoid colon wall thickening versus normal under distension birth correlate for proctocolitis. Electronically Signed   By: Darliss Cheney M.D.   On: 01/28/2022 22:42   US PELVIC COMPLETE W TRANSVAGINAL AND TORSION R/O  Result Date: 01/27/2022 CLINICAL DATA:  Left lower quadrant abdominal pain and nausea and vomiting. EXAM: TRANSABDOMINAL AND TRANSVAGINAL ULTRASOUND OF PELVIS DOPPLER ULTRASOUND OF OVARIES TECHNIQUE: Both transabdominal and transvaginal ultrasound examinations of the  pelvis were performed. Transabdominal technique was performed for global imaging of the pelvis including uterus, ovaries, adnexal regions, and pelvic cul-de-sac. It was necessary to proceed with endovaginal exam following the transabdominal exam to visualize the adnexa. Color and duplex Doppler ultrasound was utilized to evaluate blood flow to the ovaries. COMPARISON:  October 12, 2018 FINDINGS: Uterus Measurements: 8.8 x 4.9 x 5.7 cm = volume: 128 mL. No fibroids or other mass visualized. Endometrium Thickness: 9.6 mm.  No focal abnormality visualized. Right ovary Measurements: 4.1 x 2.3 x 3.3 cm = volume: 16 mL. Normal appearance/no adnexal mass. Left ovary Measurements: 3.6 x 1.6 x 2.6 cm = volume: 7.8 mL. Normal appearance/no adnexal mass. Pulsed Doppler evaluation of both ovaries demonstrates normal low-resistance arterial and venous waveforms. Other findings  Trace free fluid. IMPRESSION: Trace free fluid, with uncertain clinical significance, otherwise normal pelvic ultrasound. Electronically Signed   By: Ted Mcalpine M.D.   On: 01/27/2022 11:58    Procedures Procedures    Medications Ordered in ED Medications  oxyCODONE-acetaminophen (PERCOCET/ROXICET) 5-325 MG per tablet 1 tablet (1 tablet Oral Given 01/28/22 1842)  ondansetron (ZOFRAN-ODT) disintegrating tablet 4 mg (4 mg Oral Given 01/28/22 1841)  ondansetron (ZOFRAN) injection 4 mg (4 mg Intravenous Given 01/28/22 2126)  ketorolac (TORADOL) 15 MG/ML injection 15 mg (15 mg Intravenous Given 01/28/22 2126)  sodium chloride 0.9 % bolus 1,000 mL (0 mLs Intravenous Stopped 01/28/22 2239)  morphine (PF) 4 MG/ML injection 4 mg (4 mg Intravenous Given 01/28/22 2206)  iohexol (OMNIPAQUE) 300 MG/ML solution 100 mL (100 mLs Intravenous Contrast Given 01/28/22 2212)  potassium chloride SA (KLOR-CON M) CR tablet 40 mEq (40 mEq Oral Given 01/28/22 2228)    ED Course/ Medical Decision Making/ A&P                           Medical Decision Making Amount and/or Complexity of Data Reviewed Labs: ordered. Radiology: ordered.  Risk Prescription drug management.   This patient presents to the ED for concern of abd pain, this involves an extensive number of treatment options, and is a complaint that carries with it a high risk of complications and morbidity.  The differential diagnosis includes but is not limited to gastritis/PUD, enteritis/duodenitis, appendicitis, cholelithiasis/cholecystitis, cholangitis, pancreatitis, ruptured viscus, colitis, diverticulitis, proctitis, cystitis, pyelonephritis, ureteral colic, aortic dissection, aortic aneurysm. In women, ectopic pregnancy, pelvic inflammatory disease, ovarian cysts, and tubo-ovarian abscess were also considered. Atypical chest etiologies were also considered including ACS, PE, and pneumonia.     Comorbidities that complicate the patient  evaluation: Patients presentation is complicated by their history of ovarian cysts  Additional history obtained: Additional history obtained from family Records reviewed previous admission documents and Care Everywhere/External Records  Lab Tests: I Ordered, and personally interpreted labs.  The pertinent results include:   CBC is without leukocytosis or anemia CMP with mild hypokalemia, otherwise reassuring Lipase is negative Urine pregnancy test from yesterday was negative.   Urine culture from yesterday is pending  Imaging Studies ordered: I ordered, independently visualized, and interpreted imaging which showed  CT abd/pelvis - 1. Collapsing cyst or follicle in the right ovary measuring 1.9 cm with small amount of free fluid in the right adnexa. 2. Rectosigmoid colon wall thickening versus normal under distension birth correlate for proctocolitis.   I agree with the radiologist interpretation  Cardiac Monitoring: The patient was maintained on a cardiac monitor.  I personally viewed and interpreted the cardiac monitor which showed  an underlying rhythm of:  sinus rhythm  Medicines ordered and prescription drug management: I ordered medication including toradol, zofran, morphine, ivf for pain, nv  Reevaluation of the patient after these medicines showed that the patient    improved  Critical Interventions: toradol, zofran, morphine, ivf  Complexity of problems addressed: Patients presentation is most consistent with    Disposition: After consideration of the diagnostic results and the patients response to treatment,  I feel that the patent would benefit from discharge home.  I suspect symptoms may be related to her colitis that is found on CT.  I think that she can continue supportive care with Zofran and Bentyl at home.  I will give her a referral to GI given her presentation today.  I do not think that she has any evidence of PID, I doubt TOA, torsion or other  intra-abdominal/pelvic pathology at this time that would warrant further work-up or admission.  She is feeling much improved after medications administered in the ED and is now tolerating p.o.'s in no distress.  Have advised on plan for follow-up and strict return precautions.  She voices understanding of the plan and reasons to return.  All questions answered.  Patient stable for discharge. .   Final Clinical Impression(s) / ED Diagnoses Final diagnoses:  Generalized abdominal pain  Nausea and vomiting, unspecified vomiting type    Rx / DC Orders ED Discharge Orders     None         Karrie Meres, PA-C 01/28/22 2318    Gwyneth Sprout, MD 01/29/22 2241

## 2022-01-28 NOTE — ED Triage Notes (Addendum)
LLQ pain returned since yesterday.  Pt states she took tylenol and ibuprofen without relief.  Pt is shaking in triage.  Pt denies cannabis use but the patient/family has a very strong odor of cannabis. ?

## 2022-01-28 NOTE — ED Notes (Signed)
Patient transported to CT 

## 2022-01-28 NOTE — ED Notes (Signed)
Patient returned from CT

## 2022-01-28 NOTE — ED Notes (Signed)
Pt ambulatory to restroom. Steady gait.  ? ?Pt tolerating PO intake at this time.  ?

## 2022-01-28 NOTE — Discharge Instructions (Signed)
Take phenergan and bentyl as prescribed  ? ?Please follow up with your primary doctor or with the gastroenterologist within the next 5-7 days.  If you do not have a primary care provider, information for a healthcare clinic has been provided for you to make arrangements for follow up care. Please return to the ER sooner if you have any new or worsening symptoms, or if you have any of the following symptoms: ? ?Abdominal pain that does not go away.  ?You have a fever.  ?You keep throwing up (vomiting).  ?The pain is felt only in portions of the abdomen. Pain in the right side could possibly be appendicitis. In an adult, pain in the left lower portion of the abdomen could be colitis or diverticulitis.  ?You pass bloody or black tarry stools.  ?There is bright red blood in the stool.  ?The constipation stays for more than 4 days.  ?There is belly (abdominal) or rectal pain.  ?You do not seem to be getting better.  ?You have any questions or concerns.  ? ?

## 2022-01-28 NOTE — ED Notes (Signed)
ED Provider at bedside. 

## 2022-01-29 LAB — RPR: RPR Ser Ql: NONREACTIVE

## 2022-01-29 LAB — URINE CULTURE: Culture: <10000 — AB

## 2022-02-01 LAB — GC/CHLAMYDIA PROBE AMP (~~LOC~~) NOT AT ARMC
Chlamydia: NEGATIVE
Comment: NEGATIVE
Comment: NORMAL
Neisseria Gonorrhea: NEGATIVE

## 2022-08-14 ENCOUNTER — Emergency Department (HOSPITAL_BASED_OUTPATIENT_CLINIC_OR_DEPARTMENT_OTHER): Payer: Medicaid Other

## 2022-08-14 ENCOUNTER — Other Ambulatory Visit: Payer: Self-pay

## 2022-08-14 ENCOUNTER — Encounter (HOSPITAL_BASED_OUTPATIENT_CLINIC_OR_DEPARTMENT_OTHER): Payer: Self-pay | Admitting: Emergency Medicine

## 2022-08-14 ENCOUNTER — Emergency Department (HOSPITAL_BASED_OUTPATIENT_CLINIC_OR_DEPARTMENT_OTHER)
Admission: EM | Admit: 2022-08-14 | Discharge: 2022-08-14 | Disposition: A | Discharge status: Other Acute Inpatient Hospital | Payer: Medicaid Other | Attending: Emergency Medicine | Admitting: Emergency Medicine

## 2022-08-14 DIAGNOSIS — Z3A08 8 weeks gestation of pregnancy: Secondary | ICD-10-CM | POA: Insufficient documentation

## 2022-08-14 DIAGNOSIS — O219 Vomiting of pregnancy, unspecified: Secondary | ICD-10-CM | POA: Insufficient documentation

## 2022-08-14 DIAGNOSIS — R1110 Vomiting, unspecified: Secondary | ICD-10-CM

## 2022-08-14 DIAGNOSIS — Z3491 Encounter for supervision of normal pregnancy, unspecified, first trimester: Secondary | ICD-10-CM

## 2022-08-14 LAB — URINALYSIS, ROUTINE W REFLEX MICROSCOPIC
Bilirubin Urine: NEGATIVE
Glucose, UA: NEGATIVE mg/dL
Hgb urine dipstick: NEGATIVE
Ketones, ur: 80 mg/dL — AB
Nitrite: NEGATIVE
Protein, ur: 30 mg/dL — AB
Specific Gravity, Urine: 1.02 (ref 1.005–1.030)
pH: 7 (ref 5.0–8.0)

## 2022-08-14 LAB — CBC WITH DIFFERENTIAL/PLATELET
Abs Immature Granulocytes: 0.04 10*3/uL (ref 0.00–0.07)
Basophils Absolute: 0 10*3/uL (ref 0.0–0.1)
Basophils Relative: 0 %
Eosinophils Absolute: 0 10*3/uL (ref 0.0–0.5)
Eosinophils Relative: 0 %
HCT: 38.3 % (ref 36.0–46.0)
Hemoglobin: 13.4 g/dL (ref 12.0–15.0)
Immature Granulocytes: 0 %
Lymphocytes Relative: 21 %
Lymphs Abs: 1.9 10*3/uL (ref 0.7–4.0)
MCH: 30.7 pg (ref 26.0–34.0)
MCHC: 35 g/dL (ref 30.0–36.0)
MCV: 87.8 fL (ref 80.0–100.0)
Monocytes Absolute: 0.6 10*3/uL (ref 0.1–1.0)
Monocytes Relative: 6 %
Neutro Abs: 6.4 10*3/uL (ref 1.7–7.7)
Neutrophils Relative %: 73 %
Platelets: 228 10*3/uL (ref 150–400)
RBC: 4.36 MIL/uL (ref 3.87–5.11)
RDW: 12.2 % (ref 11.5–15.5)
WBC: 9 10*3/uL (ref 4.0–10.5)
nRBC: 0 % (ref 0.0–0.2)

## 2022-08-14 LAB — BASIC METABOLIC PANEL
Anion gap: 10 (ref 5–15)
BUN: 13 mg/dL (ref 6–20)
CO2: 23 mmol/L (ref 22–32)
Calcium: 8.9 mg/dL (ref 8.9–10.3)
Chloride: 102 mmol/L (ref 98–111)
Creatinine, Ser: 0.65 mg/dL (ref 0.44–1.00)
GFR, Estimated: >60 mL/min (ref >60)
Glucose, Bld: 128 mg/dL — ABNORMAL HIGH (ref 70–99)
Potassium: 3.3 mmol/L — ABNORMAL LOW (ref 3.5–5.1)
Sodium: 135 mmol/L (ref 135–145)

## 2022-08-14 LAB — HCG, QUANTITATIVE, PREGNANCY: hCG, Beta Chain, Quant, S: 249477 m[IU]/mL — ABNORMAL HIGH (ref <5)

## 2022-08-14 LAB — URINALYSIS, MICROSCOPIC (REFLEX)

## 2022-08-14 LAB — ABO/RH: ABO/RH(D): AB POS

## 2022-08-14 LAB — PREGNANCY, URINE: Preg Test, Ur: POSITIVE — AB

## 2022-08-14 MED ORDER — ACETAMINOPHEN 500 MG PO TABS
1000.0000 mg | ORAL_TABLET | Freq: Once | ORAL | Status: AC
Start: 2022-08-14 — End: 2022-08-14
  Administered 2022-08-14: 1000 mg via ORAL
  Filled 2022-08-14: qty 2

## 2022-08-14 MED ORDER — POTASSIUM CHLORIDE CRYS ER 20 MEQ PO TBCR
40.0000 meq | EXTENDED_RELEASE_TABLET | Freq: Once | ORAL | Status: AC
Start: 2022-08-14 — End: 2022-08-14
  Administered 2022-08-14: 40 meq via ORAL
  Filled 2022-08-14: qty 2

## 2022-08-14 MED ORDER — METOCLOPRAMIDE HCL 5 MG/ML IJ SOLN
5.0000 mg | Freq: Once | INTRAMUSCULAR | Status: AC
  Administered 2022-08-14: 5 mg via INTRAVENOUS
  Filled 2022-08-14: qty 2

## 2022-08-14 MED ORDER — VITAMIN B-6 25 MG PO TABS: 25.0000 mg | ORAL_TABLET | Freq: Three times a day (TID) | ORAL | 0 refills | Status: DC | PRN

## 2022-08-14 MED ORDER — SODIUM CHLORIDE 0.9 % IV BOLUS
1000.0000 mL | Freq: Once | INTRAVENOUS | Status: AC
  Administered 2022-08-14: 1000 mL via INTRAVENOUS

## 2022-08-14 MED ORDER — ONDANSETRON HCL 4 MG/2ML IJ SOLN
4.0000 mg | Freq: Once | INTRAMUSCULAR | Status: AC
Start: 2022-08-14 — End: 2022-08-14
  Administered 2022-08-14: 4 mg via INTRAVENOUS
  Filled 2022-08-14: qty 2

## 2022-08-14 MED ORDER — MORPHINE SULFATE (PF) 4 MG/ML IV SOLN
4.0000 mg | Freq: Once | INTRAVENOUS | Status: AC
  Administered 2022-08-14: 4 mg via INTRAVENOUS
  Filled 2022-08-14: qty 1

## 2022-08-14 MED ORDER — LACTATED RINGERS IV BOLUS
1000.0000 mL | Freq: Once | INTRAVENOUS | Status: AC
  Administered 2022-08-14: 1000 mL via INTRAVENOUS

## 2022-08-14 MED ORDER — DOXYLAMINE SUCCINATE (SLEEP) 25 MG PO TABS: 25.0000 mg | ORAL_TABLET | Freq: Every evening | ORAL | 0 refills | Status: DC | PRN

## 2022-08-14 MED ORDER — PRENATAL COMPLETE 14-0.4 MG PO TABS: 1.0000 | ORAL_TABLET | Freq: Every day | ORAL | 1 refills | Status: AC

## 2022-08-14 MED ORDER — METOCLOPRAMIDE HCL 5 MG/ML IJ SOLN
10.0000 mg | Freq: Once | INTRAMUSCULAR | Status: AC | PRN
  Administered 2022-08-14: 10 mg via INTRAVENOUS
  Filled 2022-08-14: qty 2

## 2022-08-14 MED ORDER — ONDANSETRON HCL 4 MG/2ML IJ SOLN: 4.0000 mg | Freq: Once | INTRAMUSCULAR | Status: DC | PRN

## 2022-08-14 NOTE — ED Triage Notes (Signed)
Pt c/o every morning this week and then multiple times since yesterday. Pt also c/o LLQ abdominal pain. States that there is chance that she could be pregnant.

## 2022-08-14 NOTE — Discharge Instructions (Addendum)
Go to [facility] regional antepartum admitting area for continued treatment of hyperemesis gravidarum.  The accepting doctor is Dr. Haynes Kerns.  Your ultrasound did show a intrauterine 8-week 1 day pregnancy today.

## 2022-08-14 NOTE — ED Provider Notes (Signed)
[facility] DEPT [facility] Provider Note: Veronica Spurling, MD, FACEP  CSN: [id number] MRN: [id number] ARRIVAL: 08/14/22 at [place]: MH10/MH10   CHIEF COMPLAINT  Vomiting   HISTORY OF PRESENT ILLNESS  08/14/22 3:52 AM Veronica Pittman is a 21 y.o. female with vomiting every morning this week worsening yesterday with multiple episodes of vomiting.  She is also having left lower quadrant pain which she rates as a 10 out of 10.  She could possibly be pregnant.  She has had no fever or diarrhea.  Pain is worse with movement or palpation.   History reviewed. No pertinent past medical history.  History reviewed. No pertinent surgical history.  No family history on file.  Social History   Tobacco Use   Smoking status: Never   Smokeless tobacco: Never  Vaping Use   Vaping Use: Never used  Substance Use Topics   Alcohol use: No   Drug use: Never    Prior to Admission medications   Medication Sig Start Date End Date Taking? Authorizing Provider  doxylamine, Sleep, (UNISOM) 25 MG tablet Take 1 tablet (25 mg total) by mouth at bedtime as needed. 08/14/22  Yes Elgie Congo, MD  Prenatal Vit-Fe Fumarate-FA (PRENATAL COMPLETE) 14-0.4 MG TABS Take 1 tablet by mouth daily. 08/14/22 10/13/22 Yes Elgie Congo, MD  pyridOXINE (VITAMIN B6) 25 MG tablet Take 1 tablet (25 mg total) by mouth every 8 (eight) hours as needed (nausea and vomiting). 08/14/22  Yes Elgie Congo, MD  dicyclomine (BENTYL) 20 MG tablet Take 1 tablet (20 mg total) by mouth 2 (two) times daily. 10/03/18   Deno Etienne, DO  dicyclomine (BENTYL) 20 MG tablet Take 1 tablet (20 mg total) by mouth 2 (two) times daily. 01/28/22   Couture, Cortni S, PA-C  ketorolac (TORADOL) 10 MG tablet Take 2 tablets (20 mg total) by mouth every 6 (six) hours as needed. 10/12/18   Robinson, [place] N, PA-C  ofloxacin (FLOXIN) 0.3 % OTIC solution Place 5 drops into the left ear 2 (two) times daily. 06/26/21   Suzy Bouchard, PA-C   ondansetron (ZOFRAN ODT) 4 MG disintegrating tablet Take 1 tablet (4 mg total) by mouth every 8 (eight) hours as needed for nausea or vomiting. 10/12/18   Robinson, [place] N, PA-C  promethazine (PHENERGAN) 25 MG suppository Place 1 suppository (25 mg total) rectally every 6 (six) hours as needed for nausea or vomiting. 10/03/18   Deno Etienne, DO  promethazine (PHENERGAN) 25 MG suppository Place 1 suppository (25 mg total) rectally every 8 (eight) hours as needed for nausea or vomiting. 01/28/22   Couture, Cortni S, PA-C    Allergies Patient has no known allergies.   REVIEW OF SYSTEMS  Negative except as noted here or in the History of Present Illness.   PHYSICAL EXAMINATION  Initial Vital Signs Blood pressure 115/75, pulse 80, temperature 99 F (37.2 C), resp. rate 18, height 5\' 2"  (1.575 m), weight 56.7 kg, SpO2 100 %, unknown if currently breastfeeding.  Examination General: Well-developed, well-nourished female in no acute distress; appearance consistent with age of record HENT: normocephalic; atraumatic Eyes: Normal appearance Neck: supple Heart: regular rate and rhythm Lungs: clear to auscultation bilaterally Abdomen: soft; nondistended; left lower quadrant tenderness; bowel sounds present Extremities: No deformity; full range of motion; pulses normal Neurologic: Awake, alert and oriented; motor function intact in all extremities and symmetric; no facial droop Skin: Warm and dry Psychiatric: Flat affect   RESULTS  Summary of this visit's results, reviewed  and interpreted by myself:   EKG Interpretation  Date/Time:    Ventricular Rate:    PR Interval:    QRS Duration:   QT Interval:    QTC Calculation:   R Axis:     Text Interpretation:         Laboratory Studies: Results for orders placed or performed during the hospital encounter of 08/14/22 (from the past 24 hour(s))  Urinalysis, Routine w reflex microscopic Urine, Clean Catch     Status: Abnormal    Collection Time: 08/14/22  4:04 AM  Result Value Ref Range   Color, Urine YELLOW YELLOW   APPearance CLOUDY (A) CLEAR   Specific Gravity, Urine 1.020 1.005 - 1.030   pH 7.0 5.0 - 8.0   Glucose, UA NEGATIVE NEGATIVE mg/dL   Hgb urine dipstick NEGATIVE NEGATIVE   Bilirubin Urine NEGATIVE NEGATIVE   Ketones, ur 80 (A) NEGATIVE mg/dL   Protein, ur 30 (A) NEGATIVE mg/dL   Nitrite NEGATIVE NEGATIVE   Leukocytes,Ua TRACE (A) NEGATIVE  Pregnancy, urine     Status: Abnormal   Collection Time: 08/14/22  4:04 AM  Result Value Ref Range   Preg Test, Ur POSITIVE (A) NEGATIVE  CBC with Differential     Status: None   Collection Time: 08/14/22  4:04 AM  Result Value Ref Range   WBC 9.0 4.0 - 10.5 K/uL   RBC 4.36 3.87 - 5.11 MIL/uL   Hemoglobin 13.4 12.0 - 15.0 g/dL   HCT 38.3 36.0 - 46.0 %   MCV 87.8 80.0 - 100.0 fL   MCH 30.7 26.0 - 34.0 pg   MCHC 35.0 30.0 - 36.0 g/dL   RDW 12.2 11.5 - 15.5 %   Platelets 228 150 - 400 K/uL   nRBC 0.0 0.0 - 0.2 %   Neutrophils Relative % 73 %   Neutro Abs 6.4 1.7 - 7.7 K/uL   Lymphocytes Relative 21 %   Lymphs Abs 1.9 0.7 - 4.0 K/uL   Monocytes Relative 6 %   Monocytes Absolute 0.6 0.1 - 1.0 K/uL   Eosinophils Relative 0 %   Eosinophils Absolute 0.0 0.0 - 0.5 K/uL   Basophils Relative 0 %   Basophils Absolute 0.0 0.0 - 0.1 K/uL   Immature Granulocytes 0 %   Abs Immature Granulocytes 0.04 0.00 - 0.07 K/uL  Basic metabolic panel     Status: Abnormal   Collection Time: 08/14/22  4:04 AM  Result Value Ref Range   Sodium 135 135 - 145 mmol/L   Potassium 3.3 (L) 3.5 - 5.1 mmol/L   Chloride 102 98 - 111 mmol/L   CO2 23 22 - 32 mmol/L   Glucose, Bld 128 (H) 70 - 99 mg/dL   BUN 13 6 - 20 mg/dL   Creatinine, Ser 0.65 0.44 - 1.00 mg/dL   Calcium 8.9 8.9 - 10.3 mg/dL   GFR, Estimated >60 >60 mL/min   Anion gap 10 5 - 15  Urinalysis, Microscopic (reflex)     Status: Abnormal   Collection Time: 08/14/22  4:04 AM  Result Value Ref Range   RBC / HPF 0-5 0  - 5 RBC/hpf   WBC, UA 6-10 0 - 5 WBC/hpf   Bacteria, UA FEW (A) NONE SEEN   Squamous Epithelial / LPF 6-10 0 - 5   Mucus PRESENT    Sperm, UA PRESENT   hCG, quantitative, pregnancy     Status: Abnormal   Collection Time: 08/14/22  4:21 AM  Result Value Ref  Range   hCG, Beta Chain, Quant, S 249,477 (H) <5 mIU/mL  ABO/Rh     Status: None   Collection Time: 08/14/22  4:55 AM  Result Value Ref Range   ABO/RH(D) AB POS    No rh immune globuloin      NOT A RH IMMUNE GLOBULIN CANDIDATE, PT RH POSITIVE Performed at [facility] [address] [place], [place] [address]    Imaging Studies: [place] Comp Less 14 Wks  Result Date: 08/14/2022 CLINICAL DATA:  21 year old pregnant female with history of left lower quadrant pain. EXAM: OBSTETRIC <14 WK [place] AND TRANSVAGINAL OB US TECHNIQUE: Both transabdominal and transvaginal ultrasound examinations were performed for complete evaluation of the gestation as well as the maternal uterus, adnexal regions, and pelvic cul-de-sac. Transvaginal technique was performed to assess early pregnancy. COMPARISON:  Pelvic ultrasound 01/27/2022. FINDINGS: Intrauterine gestational sac: Single Yolk sac:  Present Embryo:  Present Cardiac Activity: Present Heart Rate: 147 bpm CRL:  16.9 mm   8 w   1 d                  [place] EDC: 03/25/2023. Subchorionic hemorrhage:  None visualized. Maternal uterus/adnexae: Probable degenerating corpus luteum cyst in the left ovary incidentally noted, where there is a centrally anechoic lesion with increased through transmission and thickened walls overall measuring 2.9 x 2.6 x 2.8 cm. Right ovary is unremarkable in appearance. No free fluid in the cul-de-sac. IMPRESSION: 1. Single viable IUP with estimated gestational age of [redacted] weeks and 1 day and normal fetal heart rate of 147 beats per minute. No acute findings. 2. Probable degenerating corpus luteum cyst in the left ovary incidentally noted. Electronically Signed   By: Vinnie Langton M.D.    On: 08/14/2022 09:23    ED COURSE and MDM  Nursing notes, initial and subsequent vitals signs, including pulse oximetry, reviewed and interpreted by myself.  Vitals:   08/14/22 1000 08/14/22 1030 08/14/22 1055 08/14/22 1126  BP: (!) 106/48 107/64 113/72 113/72  Pulse: (!) 53 (!) 53 (!) 105 (!) 105  Resp: 20 20 (!) 24 20  Temp:   98.9 F (37.2 C) 98.8 F (37.1 C)  TempSrc:   Oral   SpO2: 100% 98% 100% 100%  Weight:      Height:       Medications  sodium chloride 0.9 % bolus 1,000 mL (0 mLs Intravenous Stopped 08/14/22 0451)  metoCLOPramide (REGLAN) injection 10 mg (10 mg Intravenous Given 08/14/22 0415)  potassium chloride SA (KLOR-CON M) CR tablet 40 mEq (40 mEq Oral Given 08/14/22 0719)  lactated ringers bolus 1,000 mL (0 mLs Intravenous Stopped 08/14/22 0912)  morphine (PF) 4 MG/ML injection 4 mg (4 mg Intravenous Given 08/14/22 0725)  acetaminophen (TYLENOL) tablet 1,000 mg (1,000 mg Oral Given 08/14/22 0725)  ondansetron (ZOFRAN) injection 4 mg (4 mg Intravenous Given 08/14/22 0759)  metoCLOPramide (REGLAN) injection 5 mg (5 mg Intravenous Given 08/14/22 1044)   5:12 AM Patient given IV fluid bolus and Reglan.  Her left lower quadrant pain is concerning for an ectopic or other OB complication and we will obtain a pelvic ultrasound.  7:00 AM Signed out to Dr. Nechama Guard. US pelvis pending.    PROCEDURES  Procedures   ED DIAGNOSES     ICD-10-CM   1. First trimester pregnancy  Z34.91     2. Hyperemesis  R11.10          Kordel Leavy, Jenny Reichmann, MD 08/14/22 2236

## 2022-08-14 NOTE — ED Provider Notes (Signed)
  Physical Exam  BP 113/72 (BP Location: Right Arm)   Pulse (!) 105   Temp 98.9 F (37.2 C) (Oral)   Resp (!) 24   Ht 5\' 2"  (1.575 m)   Wt 56.7 kg   LMP  (LMP Unknown)   SpO2 100%   BMI 22.86 kg/m   Physical Exam  Procedures  Procedures  ED Course / MDM   Clinical Course as of 08/14/22 1115  Sat Aug 14, 2022  0659 Received signout from previous physician.  Newly pregnant female.  With symptoms likely suggestive of hyperemesis.  Ketones in urine but no UTI , + bacteruria.  Mild hypokalemia 3.3.  Normal white blood cell count 9.0.  hCG to 249,000.  Follow-up TVUS. [VB]  [phone] Patient has received second liter of fluids and Zofran but still having nausea and nonbloody nonbilious emesis with fluids.  Will reach out to gynecology for evaluation for hyperemesis and possible admission for continued symptom control.  Ultrasound showed healthy 8-week 1 day intrauterine pregnancy and suggestive of degenerating left corpus luteum cyst.  No ectopic. [VB]  3295 Discussed with Dr. Haynes Kerns an OB/GYN at [facility] who has accepted patient for antepartum care for suspected hyperemesis gravidarum.  I have discussed the plan with the patient and patient's significant other at bedside who is in agreement with this plan.  They will go by POV.  [VB]    Clinical Course User Index [VB] Elgie Congo, MD   Medical Decision Making Amount and/or Complexity of Data Reviewed Labs: ordered. Radiology: ordered.  Risk OTC drugs. Prescription drug management.          Elgie Congo, MD 08/14/22 1115

## 2022-08-14 NOTE — ED Notes (Signed)
Copy of EMTALA and face sheet given to pt and significant other.

## 2022-08-14 NOTE — ED Notes (Signed)
IV left in place per provider.

## 2022-08-27 ENCOUNTER — Encounter (HOSPITAL_COMMUNITY): Payer: Self-pay | Admitting: Obstetrics and Gynecology

## 2022-08-27 ENCOUNTER — Inpatient Hospital Stay (HOSPITAL_COMMUNITY)
Admission: AD | Admit: 2022-08-27 | Discharge: 2022-08-27 | Disposition: A | Discharge status: Home / Self Care | Payer: Medicaid Other | Attending: Obstetrics and Gynecology | Admitting: Obstetrics and Gynecology

## 2022-08-27 DIAGNOSIS — Z3A10 10 weeks gestation of pregnancy: Secondary | ICD-10-CM | POA: Diagnosis not present

## 2022-08-27 DIAGNOSIS — R12 Heartburn: Secondary | ICD-10-CM | POA: Insufficient documentation

## 2022-08-27 DIAGNOSIS — O26891 Other specified pregnancy related conditions, first trimester: Secondary | ICD-10-CM | POA: Diagnosis not present

## 2022-08-27 DIAGNOSIS — O219 Vomiting of pregnancy, unspecified: Secondary | ICD-10-CM | POA: Diagnosis present

## 2022-08-27 DIAGNOSIS — Z79899 Other long term (current) drug therapy: Secondary | ICD-10-CM | POA: Diagnosis not present

## 2022-08-27 HISTORY — DX: Other specified health status: Z78.9

## 2022-08-27 LAB — URINALYSIS, ROUTINE W REFLEX MICROSCOPIC
Bilirubin Urine: NEGATIVE
Glucose, UA: NEGATIVE mg/dL
Hgb urine dipstick: NEGATIVE
Ketones, ur: 80 mg/dL — AB
Nitrite: NEGATIVE
Protein, ur: 30 mg/dL — AB
Specific Gravity, Urine: 1.02 (ref 1.005–1.030)
pH: 8 (ref 5.0–8.0)

## 2022-08-27 LAB — CBC
HCT: 38.1 % (ref 36.0–46.0)
Hemoglobin: 13.5 g/dL (ref 12.0–15.0)
MCH: 31.5 pg (ref 26.0–34.0)
MCHC: 35.4 g/dL (ref 30.0–36.0)
MCV: 88.8 fL (ref 80.0–100.0)
Platelets: 230 10*3/uL (ref 150–400)
RBC: 4.29 MIL/uL (ref 3.87–5.11)
RDW: 12.7 % (ref 11.5–15.5)
WBC: 9.9 10*3/uL (ref 4.0–10.5)
nRBC: 0 % (ref 0.0–0.2)

## 2022-08-27 LAB — COMPREHENSIVE METABOLIC PANEL
ALT: 21 U/L (ref 0–44)
AST: 16 U/L (ref 15–41)
Albumin: 3.8 g/dL (ref 3.5–5.0)
Alkaline Phosphatase: 47 U/L (ref 38–126)
Anion gap: 10 (ref 5–15)
BUN: 7 mg/dL (ref 6–20)
CO2: 20 mmol/L — ABNORMAL LOW (ref 22–32)
Calcium: 9.4 mg/dL (ref 8.9–10.3)
Chloride: 105 mmol/L (ref 98–111)
Creatinine, Ser: 0.57 mg/dL (ref 0.44–1.00)
GFR, Estimated: >60 mL/min (ref >60)
Glucose, Bld: 116 mg/dL — ABNORMAL HIGH (ref 70–99)
Potassium: 3.7 mmol/L (ref 3.5–5.1)
Sodium: 135 mmol/L (ref 135–145)
Total Bilirubin: 0.7 mg/dL (ref 0.3–1.2)
Total Protein: 7.1 g/dL (ref 6.5–8.1)

## 2022-08-27 MED ORDER — FAMOTIDINE 20 MG PO TABS: 20.0000 mg | ORAL_TABLET | Freq: Every day | ORAL | 0 refills | Status: DC

## 2022-08-27 MED ORDER — ONDANSETRON HCL 4 MG/2ML IJ SOLN
4.0000 mg | Freq: Once | INTRAMUSCULAR | Status: AC
  Administered 2022-08-27: 4 mg via INTRAVENOUS
  Filled 2022-08-27: qty 2

## 2022-08-27 MED ORDER — SODIUM CHLORIDE 0.9 % IV SOLN
25.0000 mg | Freq: Once | INTRAVENOUS | Status: AC
  Administered 2022-08-27: 25 mg via INTRAVENOUS
  Filled 2022-08-27: qty 1

## 2022-08-27 MED ORDER — PROMETHAZINE HCL 25 MG PO TABS: 25.0000 mg | ORAL_TABLET | Freq: Four times a day (QID) | ORAL | 0 refills | Status: DC | PRN

## 2022-08-27 MED ORDER — FAMOTIDINE IN NACL 20-0.9 MG/50ML-% IV SOLN
20.0000 mg | Freq: Once | INTRAVENOUS | Status: AC
  Administered 2022-08-27: 20 mg via INTRAVENOUS
  Filled 2022-08-27: qty 50

## 2022-08-27 MED ORDER — LACTATED RINGERS IV BOLUS
1000.0000 mL | Freq: Once | INTRAVENOUS | Status: AC
  Administered 2022-08-27: 1000 mL via INTRAVENOUS

## 2022-08-27 NOTE — MAU Provider Note (Signed)
History     [id number]  Arrival date and time: 08/27/22 1616    Chief Complaint  Patient presents with   Nausea   Emesis     HPI Veronica Pittman is a 21 y.o. at [redacted]w[redacted]d who presents for nausea & vomiting. Reports vomiting since the beginning of the pregnancy that was decently controlled for the last week on zofran. Since yesterday the symptoms have worsened again & reports vomiting countless times in the last 24 hours. Denies abdominal pain, fever, diarrhea, or vaginal bleeding.   OB History     Gravida  2   Para  1   Term  1   Preterm      AB      Living  1      SAB      IAB      Ectopic      Multiple      Live Births  1           Past Medical History:  Diagnosis Date   Medical history non-contributory     Past Surgical History:  Procedure Laterality Date   NO PAST SURGERIES      No family history on file.  No Known Allergies  No current facility-administered medications on file prior to encounter.   Current Outpatient Medications on File Prior to Encounter  Medication Sig Dispense Refill   ondansetron (ZOFRAN ODT) 4 MG disintegrating tablet Take 1 tablet (4 mg total) by mouth every 8 (eight) hours as needed for nausea or vomiting. 20 tablet 0   dicyclomine (BENTYL) 20 MG tablet Take 1 tablet (20 mg total) by mouth 2 (two) times daily. 20 tablet 0   dicyclomine (BENTYL) 20 MG tablet Take 1 tablet (20 mg total) by mouth 2 (two) times daily. 20 tablet 0   doxylamine, Sleep, (UNISOM) 25 MG tablet Take 1 tablet (25 mg total) by mouth at bedtime as needed. 30 tablet 0   ketorolac (TORADOL) 10 MG tablet Take 2 tablets (20 mg total) by mouth every 6 (six) hours as needed. 20 tablet 0   ofloxacin (FLOXIN) 0.3 % OTIC solution Place 5 drops into the left ear 2 (two) times daily. 10 mL 0   Prenatal Vit-Fe Fumarate-FA (PRENATAL COMPLETE) 14-0.4 MG TABS Take 1 tablet by mouth daily. 60 tablet 1   promethazine (PHENERGAN) 25 MG suppository Place 1 suppository  (25 mg total) rectally every 6 (six) hours as needed for nausea or vomiting. 12 each 0   promethazine (PHENERGAN) 25 MG suppository Place 1 suppository (25 mg total) rectally every 8 (eight) hours as needed for nausea or vomiting. 6 each 0   pyridOXINE (VITAMIN B6) 25 MG tablet Take 1 tablet (25 mg total) by mouth every 8 (eight) hours as needed (nausea and vomiting). 60 tablet 0     ROS Pertinent positives and negative per HPI, all others reviewed and negative  Physical Exam   BP 116/66   Pulse 77   Temp 98 F (36.7 C) (Oral)   Resp 14   Ht 5\' 2"  (1.575 m)   Wt 50.3 kg   LMP  (LMP Unknown)   SpO2 99%   BMI 20.30 kg/m   Patient Vitals for the past 24 hrs:  BP Temp Temp src Pulse Resp SpO2 Height Weight  08/27/22 1925 116/66 -- -- 77 -- 99 % -- --  08/27/22 1835 -- -- -- -- -- 100 % -- --  08/27/22 1636 125/74 -- -- 63 -- 98 % -- --  08/27/22 1631 (!) 103/53 98 F (36.7 C) Oral 63 14 99 % -- --  08/27/22 1624 -- -- -- -- -- -- 5\' 2"  (1.575 m) 50.3 kg    Physical Exam Vitals and nursing note reviewed.  Constitutional:      General: She is not in acute distress.    Appearance: Normal appearance.  HENT:     Head: Normocephalic and atraumatic.  Eyes:     General: No scleral icterus.    Conjunctiva/sclera: Conjunctivae normal.  Pulmonary:     Effort: Pulmonary effort is normal. No respiratory distress.  Abdominal:     Palpations: Abdomen is soft.     Tenderness: There is no abdominal tenderness.  Neurological:     Mental Status: She is alert.  Psychiatric:        Mood and Affect: Mood normal.        Behavior: Behavior normal.      Bedside Ultrasound Pt informed that the ultrasound is considered a limited OB ultrasound and is not intended to be a complete ultrasound exam.  Patient also informed that the ultrasound is not being completed with the intent of assessing for fetal or placental anomalies or any pelvic abnormalities.  Explained that the purpose of today's  ultrasound is to assess for  viability.  Patient acknowledges the purpose of the exam and the limitations of the study.     My interpretation: live IUP< FHR 164 bpm   Labs Results for orders placed or performed during the hospital encounter of 08/27/22 (from the past 24 hour(s))  Urinalysis, Routine w reflex microscopic Urine, Clean Catch     Status: Abnormal   Collection Time: 08/27/22  4:57 PM  Result Value Ref Range   Color, Urine AMBER (A) YELLOW   APPearance CLOUDY (A) CLEAR   Specific Gravity, Urine 1.020 1.005 - 1.030   pH 8.0 5.0 - 8.0   Glucose, UA NEGATIVE NEGATIVE mg/dL   Hgb urine dipstick NEGATIVE NEGATIVE   Bilirubin Urine NEGATIVE NEGATIVE   Ketones, ur 80 (A) NEGATIVE mg/dL   Protein, ur 30 (A) NEGATIVE mg/dL   Nitrite NEGATIVE NEGATIVE   Leukocytes,Ua SMALL (A) NEGATIVE   RBC / HPF 0-5 0 - 5 RBC/hpf   WBC, UA 6-10 0 - 5 WBC/hpf   Bacteria, UA RARE (A) NONE SEEN   Squamous Epithelial / LPF 21-50 0 - 5   Mucus PRESENT    Amorphous Crystal PRESENT   Comprehensive metabolic panel     Status: Abnormal   Collection Time: 08/27/22  5:40 PM  Result Value Ref Range   Sodium 135 135 - 145 mmol/L   Potassium 3.7 3.5 - 5.1 mmol/L   Chloride 105 98 - 111 mmol/L   CO2 20 (L) 22 - 32 mmol/L   Glucose, Bld 116 (H) 70 - 99 mg/dL   BUN 7 6 - 20 mg/dL   Creatinine, Ser 10/27/22 0.44 - 1.00 mg/dL   Calcium 9.4 8.9 - 6.43 mg/dL   Total Protein 7.1 6.5 - 8.1 g/dL   Albumin 3.8 3.5 - 5.0 g/dL   AST 16 15 - 41 U/L   ALT 21 0 - 44 U/L   Alkaline Phosphatase 47 38 - 126 U/L   Total Bilirubin 0.7 0.3 - 1.2 mg/dL   GFR, Estimated 32.9 >51 mL/min   Anion gap 10 5 - 15  CBC     Status: None   Collection Time: 08/27/22  5:40 PM  Result Value Ref Range  WBC 9.9 4.0 - 10.5 K/uL   RBC 4.29 3.87 - 5.11 MIL/uL   Hemoglobin 13.5 12.0 - 15.0 g/dL   HCT 96.0 45.4 - 09.8 %   MCV 88.8 80.0 - 100.0 fL   MCH 31.5 26.0 - 34.0 pg   MCHC 35.4 30.0 - 36.0 g/dL   RDW 11.9 14.7 - 82.9 %    Platelets 230 150 - 400 K/uL   nRBC 0.0 0.0 - 0.2 %    Imaging No results found.  MAU Course  Procedures Lab Orders         Urinalysis, Routine w reflex microscopic Urine, Clean Catch         Comprehensive metabolic panel         CBC    Meds ordered this encounter  Medications   lactated ringers bolus 1,000 mL   famotidine (PEPCID) IVPB 20 mg premix   promethazine (PHENERGAN) 25 mg in sodium chloride 0.9 % 50 mL IVPB   ondansetron (ZOFRAN) injection 4 mg   promethazine (PHENERGAN) 25 MG tablet    Sig: Take 1 tablet (25 mg total) by mouth every 6 (six) hours as needed for nausea or vomiting.    Dispense:  30 tablet    Refill:  0    Order Specific Question:   Supervising Provider    Answer:   Mariel Aloe A [[id number]]   famotidine (PEPCID) 20 MG tablet    Sig: Take 1 tablet (20 mg total) by mouth daily.    Dispense:  30 tablet    Refill:  0    Order Specific Question:   Supervising Provider    Answer:   Warden Fillers [[id number]]   Imaging Orders  No imaging studies ordered today    MDM Presents for n/v & heartburn. RN unable to doppler FHTs. Cardiac activity present vis bedside ultrasound. Patient had IUP confirmed on ultrasound in care everywhere.   Treated in MAU with IV fluids, zofran, phenergan, & pepcid. Reports improvement in symptoms. Will prescribed meds.   Assessment and Plan   1. Nausea and vomiting during pregnancy prior to [redacted] weeks gestation   2. Heartburn during pregnancy in first trimester   3. [redacted] weeks gestation of pregnancy    -Rx phenergan & pepcid -reviewed reasons to return to MAU -F/u with OB  Judeth Horn, NP 08/27/22 7:48 PM

## 2022-08-27 NOTE — MAU Note (Signed)
.  Veronica Pittman is a 21 y.o. at [redacted]w[redacted]d here in MAU reporting: has not been able to keep anything down for the last 24 hrs. States she has lost 10lbs since the beginning of pregnancy. Is taking zofran, last dose was sometime this morning. Denies VB or abnormal discharge. Also having lower abdominal pain that is constant and feels like pressure.   Pain score: 10 Vitals:   08/27/22 1631  BP: (!) 103/53  Pulse: 63  Resp: 14  Temp: 98 F (36.7 C)  SpO2: 99%     NOI:[id number] Lab orders placed from triage:  UA

## 2022-08-28 ENCOUNTER — Encounter (HOSPITAL_COMMUNITY): Payer: Self-pay | Admitting: Obstetrics & Gynecology

## 2022-08-28 ENCOUNTER — Inpatient Hospital Stay (HOSPITAL_COMMUNITY)
Admission: AD | Admit: 2022-08-28 | Discharge: 2022-08-28 | Disposition: A | Discharge status: Home / Self Care | Payer: Medicaid Other | Attending: Obstetrics & Gynecology | Admitting: Obstetrics & Gynecology

## 2022-08-28 DIAGNOSIS — O26851 Spotting complicating pregnancy, first trimester: Secondary | ICD-10-CM

## 2022-08-28 DIAGNOSIS — Z6730 Type AB blood, Rh positive: Secondary | ICD-10-CM

## 2022-08-28 DIAGNOSIS — O209 Hemorrhage in early pregnancy, unspecified: Secondary | ICD-10-CM | POA: Insufficient documentation

## 2022-08-28 DIAGNOSIS — Z3A10 10 weeks gestation of pregnancy: Secondary | ICD-10-CM | POA: Insufficient documentation

## 2022-08-28 DIAGNOSIS — O26891 Other specified pregnancy related conditions, first trimester: Secondary | ICD-10-CM | POA: Insufficient documentation

## 2022-08-28 LAB — WET PREP, GENITAL
Sperm: NONE SEEN
Trich, Wet Prep: NONE SEEN
WBC, Wet Prep HPF POC: <10 (ref <10)
Yeast Wet Prep HPF POC: NONE SEEN

## 2022-08-28 LAB — URINALYSIS, ROUTINE W REFLEX MICROSCOPIC
Bilirubin Urine: NEGATIVE
Glucose, UA: NEGATIVE mg/dL
Hgb urine dipstick: NEGATIVE
Ketones, ur: 80 mg/dL — AB
Nitrite: NEGATIVE
Protein, ur: 30 mg/dL — AB
Specific Gravity, Urine: 1.024 (ref 1.005–1.030)
pH: 5 (ref 5.0–8.0)

## 2022-08-28 NOTE — MAU Note (Signed)
.  Veronica Pittman is a 21 y.o. at [redacted]w[redacted]d here in MAU reporting: cramping all day, went to bathroom 2 hours ago, wiped and saw blood. Denies needing to wear a pad. Pt used bathroom in lobby and noticed blood again when she wiped - bright red. States last intercourse was on Thursday.   Onset of complaint: 2030 Pain score: 9 - intermittent cramping  Vitals:   08/28/22 2244  BP: 121/70  Pulse: 80  Resp: 17  Temp: 98.1 F (36.7 C)  SpO2: 99%     FHT: unable to obtain in triage.  Lab orders placed from triage:  UA

## 2022-08-28 NOTE — MAU Provider Note (Signed)
History     CSN: [id number]  Arrival date and time: 08/28/22 2222   Event Date/Time   First Provider Initiated Contact with Patient 08/28/22 2305      Chief Complaint  Patient presents with   Abdominal Pain   Vaginal Bleeding   Veronica Pittman is a 21 y.o. G2P1001 at [redacted]w[redacted]d who presents today with spotting. She states that around 1900 today she had two streaks of spotting and then a little after that she had another streak of spotting. She had some pain earlier today, but denies any pain at this time. She has an appt Monday to start prenatal care with Dr. Micah Noel on Monday 08/30/2022. She denies any vaginal discharge, or dysuria.   Vaginal Bleeding The patient's primary symptoms include pelvic pain and vaginal bleeding. This is a new problem. The current episode started today. The problem occurs intermittently. The problem has been resolved. She is pregnant. The vaginal discharge was bloody. The vaginal bleeding is spotting. She has not been passing clots. She has not been passing tissue. Nothing aggravates the symptoms. She has tried nothing for the symptoms.    OB History     Gravida  2   Para  1   Term  1   Preterm      AB      Living  1      SAB      IAB      Ectopic      Multiple      Live Births  1           Past Medical History:  Diagnosis Date   Medical history non-contributory     Past Surgical History:  Procedure Laterality Date   NO PAST SURGERIES      No family history on file.  Social History   Tobacco Use   Smoking status: Never   Smokeless tobacco: Never  Vaping Use   Vaping Use: Never used  Substance Use Topics   Alcohol use: No   Drug use: Never    Allergies: No Known Allergies  Medications Prior to Admission  Medication Sig Dispense Refill Last Dose   dicyclomine (BENTYL) 20 MG tablet Take 1 tablet (20 mg total) by mouth 2 (two) times daily. 20 tablet 0    doxylamine, Sleep, (UNISOM) 25 MG tablet Take 1 tablet (25 mg total)  by mouth at bedtime as needed. 30 tablet 0    famotidine (PEPCID) 20 MG tablet Take 1 tablet (20 mg total) by mouth daily. 30 tablet 0    Prenatal Vit-Fe Fumarate-FA (PRENATAL COMPLETE) 14-0.4 MG TABS Take 1 tablet by mouth daily. 60 tablet 1    promethazine (PHENERGAN) 25 MG tablet Take 1 tablet (25 mg total) by mouth every 6 (six) hours as needed for nausea or vomiting. 30 tablet 0    pyridOXINE (VITAMIN B6) 25 MG tablet Take 1 tablet (25 mg total) by mouth every 8 (eight) hours as needed (nausea and vomiting). 60 tablet 0     Review of Systems  Genitourinary:  Positive for pelvic pain.  All other systems reviewed and are negative.  Physical Exam   Blood pressure 121/70, pulse 80, temperature 98.1 F (36.7 C), temperature source Oral, resp. rate 17, height 5\' 2"  (1.575 m), weight 54.3 kg, SpO2 99 %, unknown if currently breastfeeding.  Physical Exam Constitutional:      Appearance: She is well-developed.  HENT:     Head: Normocephalic.  Eyes:     Pupils:  Pupils are equal, round, and reactive to light.  Cardiovascular:     Rate and Rhythm: Normal rate and regular rhythm.     Heart sounds: Normal heart sounds.  Pulmonary:     Effort: Pulmonary effort is normal. No respiratory distress.     Breath sounds: Normal breath sounds.  Abdominal:     Palpations: Abdomen is soft.     Tenderness: There is no abdominal tenderness.  Genitourinary:    Vagina: No bleeding. Vaginal discharge: mucusy.    Comments: External: no lesion Vagina: small amount of white discharge     Musculoskeletal:        General: Normal range of motion.     Cervical back: Normal range of motion and neck supple.  Skin:    General: Skin is warm and dry.  Neurological:     Mental Status: She is alert and oriented to person, place, and time.  Psychiatric:        Mood and Affect: Mood normal.        Behavior: Behavior normal.    Pt informed that the ultrasound is considered a limited OB ultrasound and is not  intended to be a complete ultrasound exam.  Patient also informed that the ultrasound is not being completed with the intent of assessing for fetal or placental anomalies or any pelvic abnormalities.  Explained that the purpose of today's ultrasound is to assess for  viability.  Patient acknowledges the purpose of the exam and the limitations of the study.    +FHT 160bpm   Results for orders placed or performed during the hospital encounter of 08/28/22 (from the past 24 hour(s))  Urinalysis, Routine w reflex microscopic Urine, Clean Catch     Status: Abnormal   Collection Time: 08/28/22 10:49 PM  Result Value Ref Range   Color, Urine AMBER (A) YELLOW   APPearance CLOUDY (A) CLEAR   Specific Gravity, Urine 1.024 1.005 - 1.030   pH 5.0 5.0 - 8.0   Glucose, UA NEGATIVE NEGATIVE mg/dL   Hgb urine dipstick NEGATIVE NEGATIVE   Bilirubin Urine NEGATIVE NEGATIVE   Ketones, ur 80 (A) NEGATIVE mg/dL   Protein, ur 30 (A) NEGATIVE mg/dL   Nitrite NEGATIVE NEGATIVE   Leukocytes,Ua TRACE (A) NEGATIVE   RBC / HPF 0-5 0 - 5 RBC/hpf   WBC, UA 6-10 0 - 5 WBC/hpf   Bacteria, UA RARE (A) NONE SEEN   Squamous Epithelial / LPF 21-50 0 - 5   Mucus PRESENT   Wet prep, genital     Status: Abnormal   Collection Time: 08/28/22 11:05 PM   Specimen: Vaginal  Result Value Ref Range   Yeast Wet Prep HPF POC NONE SEEN NONE SEEN   Trich, Wet Prep NONE SEEN NONE SEEN   Clue Cells Wet Prep HPF POC PRESENT (A) NONE SEEN   WBC, Wet Prep HPF POC <10 <10   Sperm NONE SEEN     MAU Course  Procedures  MDM   Assessment and Plan   1. Spotting affecting pregnancy in first trimester   2. [redacted] weeks gestation of pregnancy   3. Type AB blood, Rh positive    DC home in stable condition  GC/CT pending  Comfort measures reviewed  1st Trimester precautions  Bleeding precautions RX: none  Return to MAU as needed FU with OB as planned   Follow-up Information     Glory Buff, MD Follow up.   Specialty:  Obstetrics and Gynecology Contact information: [address] [address] [phone]                Marcille Buffy DNP, CNM  08/28/22  11:24 PM

## 2022-08-30 LAB — CULTURE, OB URINE: Culture: NO GROWTH

## 2022-08-30 LAB — GC/CHLAMYDIA PROBE AMP (~~LOC~~) NOT AT ARMC
Chlamydia: NEGATIVE
Comment: NEGATIVE
Comment: NORMAL
Neisseria Gonorrhea: NEGATIVE

## 2022-09-03 ENCOUNTER — Other Ambulatory Visit: Payer: Self-pay

## 2022-09-03 ENCOUNTER — Encounter (HOSPITAL_COMMUNITY): Payer: Self-pay | Admitting: Family Medicine

## 2022-09-03 ENCOUNTER — Inpatient Hospital Stay (HOSPITAL_COMMUNITY)
Admission: AD | Admit: 2022-09-03 | Discharge: 2022-09-03 | Disposition: A | Discharge status: Home / Self Care | Payer: Medicaid Other | Attending: Family Medicine | Admitting: Family Medicine

## 2022-09-03 DIAGNOSIS — R1030 Lower abdominal pain, unspecified: Secondary | ICD-10-CM | POA: Insufficient documentation

## 2022-09-03 DIAGNOSIS — R531 Weakness: Secondary | ICD-10-CM | POA: Insufficient documentation

## 2022-09-03 DIAGNOSIS — O99321 Drug use complicating pregnancy, first trimester: Secondary | ICD-10-CM | POA: Insufficient documentation

## 2022-09-03 DIAGNOSIS — O26891 Other specified pregnancy related conditions, first trimester: Secondary | ICD-10-CM | POA: Diagnosis present

## 2022-09-03 DIAGNOSIS — Z3A11 11 weeks gestation of pregnancy: Secondary | ICD-10-CM | POA: Insufficient documentation

## 2022-09-03 DIAGNOSIS — R824 Acetonuria: Secondary | ICD-10-CM | POA: Insufficient documentation

## 2022-09-03 DIAGNOSIS — O210 Mild hyperemesis gravidarum: Secondary | ICD-10-CM | POA: Diagnosis not present

## 2022-09-03 DIAGNOSIS — F12188 Cannabis abuse with other cannabis-induced disorder: Secondary | ICD-10-CM | POA: Insufficient documentation

## 2022-09-03 LAB — CBC WITH DIFFERENTIAL/PLATELET
Abs Immature Granulocytes: 0.06 10*3/uL (ref 0.00–0.07)
Basophils Absolute: 0 10*3/uL (ref 0.0–0.1)
Basophils Relative: 0 %
Eosinophils Absolute: 0 10*3/uL (ref 0.0–0.5)
Eosinophils Relative: 0 %
HCT: 40.9 % (ref 36.0–46.0)
Hemoglobin: 14.4 g/dL (ref 12.0–15.0)
Immature Granulocytes: 1 %
Lymphocytes Relative: 11 %
Lymphs Abs: 1.1 10*3/uL (ref 0.7–4.0)
MCH: 31.2 pg (ref 26.0–34.0)
MCHC: 35.2 g/dL (ref 30.0–36.0)
MCV: 88.5 fL (ref 80.0–100.0)
Monocytes Absolute: 0.7 10*3/uL (ref 0.1–1.0)
Monocytes Relative: 7 %
Neutro Abs: 8.3 10*3/uL — ABNORMAL HIGH (ref 1.7–7.7)
Neutrophils Relative %: 81 %
Platelets: 215 10*3/uL (ref 150–400)
RBC: 4.62 MIL/uL (ref 3.87–5.11)
RDW: 12.2 % (ref 11.5–15.5)
WBC: 10.2 10*3/uL (ref 4.0–10.5)
nRBC: 0 % (ref 0.0–0.2)

## 2022-09-03 LAB — URINALYSIS, ROUTINE W REFLEX MICROSCOPIC
Bilirubin Urine: NEGATIVE
Glucose, UA: NEGATIVE mg/dL
Hgb urine dipstick: NEGATIVE
Ketones, ur: 80 mg/dL — AB
Nitrite: NEGATIVE
Protein, ur: 30 mg/dL — AB
Specific Gravity, Urine: 1.015 (ref 1.005–1.030)
pH: 5 (ref 5.0–8.0)

## 2022-09-03 LAB — COMPREHENSIVE METABOLIC PANEL
ALT: 19 U/L (ref 0–44)
AST: 18 U/L (ref 15–41)
Albumin: 3.5 g/dL (ref 3.5–5.0)
Alkaline Phosphatase: 53 U/L (ref 38–126)
Anion gap: 14 (ref 5–15)
BUN: 6 mg/dL (ref 6–20)
CO2: 22 mmol/L (ref 22–32)
Calcium: 9.3 mg/dL (ref 8.9–10.3)
Chloride: 97 mmol/L — ABNORMAL LOW (ref 98–111)
Creatinine, Ser: 0.77 mg/dL (ref 0.44–1.00)
GFR, Estimated: >60 mL/min (ref >60)
Glucose, Bld: 101 mg/dL — ABNORMAL HIGH (ref 70–99)
Potassium: 3.6 mmol/L (ref 3.5–5.1)
Sodium: 133 mmol/L — ABNORMAL LOW (ref 135–145)
Total Bilirubin: 1.1 mg/dL (ref 0.3–1.2)
Total Protein: 6.8 g/dL (ref 6.5–8.1)

## 2022-09-03 MED ORDER — LACTATED RINGERS IV BOLUS
1000.0000 mL | Freq: Once | INTRAVENOUS | Status: AC
  Administered 2022-09-03: 1000 mL via INTRAVENOUS

## 2022-09-03 MED ORDER — M.V.I. ADULT IV INJ
Freq: Once | INTRAVENOUS | Status: AC
  Filled 2022-09-03: qty 10

## 2022-09-03 MED ORDER — DIPHENHYDRAMINE HCL 50 MG/ML IJ SOLN
25.0000 mg | Freq: Once | INTRAMUSCULAR | Status: AC
  Administered 2022-09-03: 25 mg via INTRAVENOUS
  Filled 2022-09-03: qty 1

## 2022-09-03 MED ORDER — HALOPERIDOL LACTATE 5 MG/ML IJ SOLN
2.0000 mg | Freq: Once | INTRAMUSCULAR | Status: AC
  Administered 2022-09-03: 2 mg via INTRAVENOUS
  Filled 2022-09-03: qty 0.4

## 2022-09-03 NOTE — MAU Provider Note (Signed)
History     CSN: [id number]  Arrival date and time: 09/03/22 1825   Event Date/Time   First Provider Initiated Contact with Patient 09/03/22 2008      Chief Complaint  Patient presents with   Abdominal Pain   Nausea   HPI Veronica Pittman is a 21 y.o. G2P1001 at [redacted]w[redacted]d who presents to MAU with chief complaint of vomiting. This is a recurrent problem for which patient has been evaluated in MAU on 08/27/2022 and 08/28/2022. Patient has started using the medications prescribed after those encounters but feels they are not as effective as they were initially. She has vomited more than ten times today. She is visualizing green fluid within her emesis. She endorses feeling weak and dehydrated but is not experiencing dizziness, weakness or syncope.  Patient endorses lower abdominal pain 2/2 vomiting episodes. She is not experiencing abdominal pain outside of vomiting episodes.    Patient states she last smoked THC about three weeks ago. She is agreeable to being treated for Cannabis Hyperemesis.  OB History     Gravida  2   Para  1   Term  1   Preterm      AB      Living  1      SAB      IAB      Ectopic      Multiple      Live Births  1           Past Medical History:  Diagnosis Date   Medical history non-contributory     Past Surgical History:  Procedure Laterality Date   NO PAST SURGERIES      History reviewed. No pertinent family history.  Social History   Tobacco Use   Smoking status: Never   Smokeless tobacco: Never  Vaping Use   Vaping Use: Never used  Substance Use Topics   Alcohol use: No   Drug use: Never    Allergies: No Known Allergies  Medications Prior to Admission  Medication Sig Dispense Refill Last Dose   dicyclomine (BENTYL) 20 MG tablet Take 1 tablet (20 mg total) by mouth 2 (two) times daily. 20 tablet 0 09/03/2022   promethazine (PHENERGAN) 25 MG tablet Take 1 tablet (25 mg total) by mouth every 6 (six) hours as needed for  nausea or vomiting. 30 tablet 0 09/03/2022   doxylamine, Sleep, (UNISOM) 25 MG tablet Take 1 tablet (25 mg total) by mouth at bedtime as needed. 30 tablet 0    famotidine (PEPCID) 20 MG tablet Take 1 tablet (20 mg total) by mouth daily. 30 tablet 0    Prenatal Vit-Fe Fumarate-FA (PRENATAL COMPLETE) 14-0.4 MG TABS Take 1 tablet by mouth daily. 60 tablet 1    pyridOXINE (VITAMIN B6) 25 MG tablet Take 1 tablet (25 mg total) by mouth every 8 (eight) hours as needed (nausea and vomiting). 60 tablet 0     Review of Systems  Gastrointestinal:  Positive for abdominal pain, nausea and vomiting.  All other systems reviewed and are negative.  Physical Exam   Blood pressure 104/73, pulse (!) 133, temperature 98.2 F (36.8 C), temperature source Oral, resp. rate 16, height 5\' 2"  (1.575 m), weight 50.8 kg, SpO2 98 %, unknown if currently breastfeeding.  Physical Exam Vitals and nursing note reviewed. Exam conducted with a chaperone present.  Constitutional:      Appearance: She is well-developed. She is ill-appearing.  HENT:     Mouth/Throat:     Mouth:  Mucous membranes are dry.  Cardiovascular:     Rate and Rhythm: Normal rate.     Heart sounds: Normal heart sounds.  Pulmonary:     Effort: Pulmonary effort is normal.     Breath sounds: Normal breath sounds.  Abdominal:     General: Bowel sounds are normal.     Palpations: Abdomen is soft.     Tenderness: There is no abdominal tenderness.  Skin:    Capillary Refill: Capillary refill takes less than 2 seconds.  Neurological:     Mental Status: She is alert and oriented to person, place, and time.  Psychiatric:        Mood and Affect: Mood normal.        Behavior: Behavior normal.    MAU Course  Procedures  MDM  --IUP confirmed 08/14/2022 --Reviewed Cannabis Hyperemesis. Symptoms persist even if patient isn't currently using  Orders Placed This Encounter  Procedures   Urinalysis, Routine w reflex microscopic Urine, Clean Catch    Comprehensive metabolic panel   CBC with Differential/Platelet   Insert peripheral IV   Discharge patient   Patient Vitals for the past 24 hrs:  BP Temp Temp src Pulse Resp SpO2 Height Weight  09/03/22 2232 119/75 -- -- 90 -- -- -- --  09/03/22 2025 -- -- -- -- -- 100 % -- --  09/03/22 1843 104/73 98.2 F (36.8 C) Oral (!) 133 16 98 % -- --  09/03/22 1837 -- -- -- -- -- -- 5\' 2"  (1.575 m) 50.8 kg   Results for orders placed or performed during the hospital encounter of 09/03/22 (from the past 24 hour(s))  Urinalysis, Routine w reflex microscopic Urine, Clean Catch     Status: Abnormal   Collection Time: 09/03/22  6:42 PM  Result Value Ref Range   Color, Urine YELLOW YELLOW   APPearance CLOUDY (A) CLEAR   Specific Gravity, Urine 1.015 1.005 - 1.030   pH 5.0 5.0 - 8.0   Glucose, UA NEGATIVE NEGATIVE mg/dL   Hgb urine dipstick NEGATIVE NEGATIVE   Bilirubin Urine NEGATIVE NEGATIVE   Ketones, ur 80 (A) NEGATIVE mg/dL   Protein, ur 30 (A) NEGATIVE mg/dL   Nitrite NEGATIVE NEGATIVE   Leukocytes,Ua SMALL (A) NEGATIVE   RBC / HPF 6-10 0 - 5 RBC/hpf   WBC, UA 11-20 0 - 5 WBC/hpf   Bacteria, UA RARE (A) NONE SEEN   Squamous Epithelial / LPF 21-50 0 - 5   Mucus PRESENT   Comprehensive metabolic panel     Status: Abnormal   Collection Time: 09/03/22  8:28 PM  Result Value Ref Range   Sodium 133 (L) 135 - 145 mmol/L   Potassium 3.6 3.5 - 5.1 mmol/L   Chloride 97 (L) 98 - 111 mmol/L   CO2 22 22 - 32 mmol/L   Glucose, Bld 101 (H) 70 - 99 mg/dL   BUN 6 6 - 20 mg/dL   Creatinine, Ser 0.77 0.44 - 1.00 mg/dL   Calcium 9.3 8.9 - 10.3 mg/dL   Total Protein 6.8 6.5 - 8.1 g/dL   Albumin 3.5 3.5 - 5.0 g/dL   AST 18 15 - 41 U/L   ALT 19 0 - 44 U/L   Alkaline Phosphatase 53 38 - 126 U/L   Total Bilirubin 1.1 0.3 - 1.2 mg/dL   GFR, Estimated >60 >60 mL/min   Anion gap 14 5 - 15  CBC with Differential/Platelet     Status: Abnormal   Collection Time: 09/03/22  8:28 PM  Result Value Ref Range    WBC 10.2 4.0 - 10.5 K/uL   RBC 4.62 3.87 - 5.11 MIL/uL   Hemoglobin 14.4 12.0 - 15.0 g/dL   HCT 09.3 23.5 - 57.3 %   MCV 88.5 80.0 - 100.0 fL   MCH 31.2 26.0 - 34.0 pg   MCHC 35.2 30.0 - 36.0 g/dL   RDW 22.0 25.4 - 27.0 %   Platelets 215 150 - 400 K/uL   nRBC 0.0 0.0 - 0.2 %   Neutrophils Relative % 81 %   Neutro Abs 8.3 (H) 1.7 - 7.7 K/uL   Lymphocytes Relative 11 %   Lymphs Abs 1.1 0.7 - 4.0 K/uL   Monocytes Relative 7 %   Monocytes Absolute 0.7 0.1 - 1.0 K/uL   Eosinophils Relative 0 %   Eosinophils Absolute 0.0 0.0 - 0.5 K/uL   Basophils Relative 0 %   Basophils Absolute 0.0 0.0 - 0.1 K/uL   Immature Granulocytes 1 %   Abs Immature Granulocytes 0.06 0.00 - 0.07 K/uL   Meds ordered this encounter  Medications   lactated ringers bolus 1,000 mL   M.V.I. Adult (INFUVITE ADULT) 10 mL in lactated ringers 1,000 mL infusion   haloperidol lactate (HALDOL) injection 2 mg   diphenhydrAMINE (BENADRYL) injection 25 mg   Assessment and Plan  --21 y.o. G2P1001 at [redacted]w[redacted]d  --Cannabis Hyperemesis --178 by Doppler --Vomiting with ketonuria --S/p IV repletion --Tolerating ice chips, declines PO challenge with solid food --Continue previously prescribed Phenergan based on previous success --Discharge home in stable condition  Calvert Cantor, MSA, MSN, CNM 09/03/2022, 11:54 PM

## 2022-09-03 NOTE — MAU Note (Signed)
Veronica Pittman is a 21 y.o. at [redacted]w[redacted]d here in MAU reporting: states she has been vomiting "green stuff" all day and lower abdominal pain. States she took meds at 1500 but cannot remember which one. 4 episodes of emesis today. No bleeding since a couple days ago.   Onset of complaint: today  Pain score: 8/10  Vitals:   09/03/22 1843  BP: 104/73  Pulse: (!) 133  Resp: 16  Temp: 98.2 F (36.8 C)  SpO2: 98%     FHT:178  Lab orders placed from triage: UA

## 2022-09-06 ENCOUNTER — Emergency Department (HOSPITAL_BASED_OUTPATIENT_CLINIC_OR_DEPARTMENT_OTHER)
Admission: EM | Admit: 2022-09-06 | Discharge: 2022-09-06 | Disposition: A | Discharge status: Home / Self Care | Payer: Medicaid Other | Attending: Emergency Medicine | Admitting: Emergency Medicine

## 2022-09-06 ENCOUNTER — Encounter (HOSPITAL_BASED_OUTPATIENT_CLINIC_OR_DEPARTMENT_OTHER): Payer: Self-pay | Admitting: Emergency Medicine

## 2022-09-06 ENCOUNTER — Other Ambulatory Visit: Payer: Self-pay

## 2022-09-06 DIAGNOSIS — O219 Vomiting of pregnancy, unspecified: Secondary | ICD-10-CM | POA: Insufficient documentation

## 2022-09-06 DIAGNOSIS — E871 Hypo-osmolality and hyponatremia: Secondary | ICD-10-CM | POA: Diagnosis not present

## 2022-09-06 DIAGNOSIS — E876 Hypokalemia: Secondary | ICD-10-CM | POA: Diagnosis not present

## 2022-09-06 DIAGNOSIS — O26891 Other specified pregnancy related conditions, first trimester: Secondary | ICD-10-CM | POA: Insufficient documentation

## 2022-09-06 DIAGNOSIS — R1030 Lower abdominal pain, unspecified: Secondary | ICD-10-CM | POA: Diagnosis not present

## 2022-09-06 DIAGNOSIS — O99281 Endocrine, nutritional and metabolic diseases complicating pregnancy, first trimester: Secondary | ICD-10-CM | POA: Diagnosis not present

## 2022-09-06 LAB — URINALYSIS, ROUTINE W REFLEX MICROSCOPIC
Glucose, UA: NEGATIVE mg/dL
Hgb urine dipstick: NEGATIVE
Ketones, ur: >=80 mg/dL — AB
Leukocytes,Ua: NEGATIVE
Nitrite: NEGATIVE
Protein, ur: 30 mg/dL — AB
Specific Gravity, Urine: >=1.03 (ref 1.005–1.030)
pH: 6 (ref 5.0–8.0)

## 2022-09-06 LAB — CBC
HCT: 39.1 % (ref 36.0–46.0)
Hemoglobin: 14 g/dL (ref 12.0–15.0)
MCH: 30.7 pg (ref 26.0–34.0)
MCHC: 35.8 g/dL (ref 30.0–36.0)
MCV: 85.7 fL (ref 80.0–100.0)
Platelets: 215 10*3/uL (ref 150–400)
RBC: 4.56 MIL/uL (ref 3.87–5.11)
RDW: 11.9 % (ref 11.5–15.5)
WBC: 9.5 10*3/uL (ref 4.0–10.5)
nRBC: 0 % (ref 0.0–0.2)

## 2022-09-06 LAB — COMPREHENSIVE METABOLIC PANEL
ALT: 20 U/L (ref 0–44)
AST: 18 U/L (ref 15–41)
Albumin: 3.8 g/dL (ref 3.5–5.0)
Alkaline Phosphatase: 50 U/L (ref 38–126)
Anion gap: 10 (ref 5–15)
BUN: 8 mg/dL (ref 6–20)
CO2: 23 mmol/L (ref 22–32)
Calcium: 8.7 mg/dL — ABNORMAL LOW (ref 8.9–10.3)
Chloride: 101 mmol/L (ref 98–111)
Creatinine, Ser: 0.46 mg/dL (ref 0.44–1.00)
GFR, Estimated: >60 mL/min (ref >60)
Glucose, Bld: 93 mg/dL (ref 70–99)
Potassium: 3.4 mmol/L — ABNORMAL LOW (ref 3.5–5.1)
Sodium: 134 mmol/L — ABNORMAL LOW (ref 135–145)
Total Bilirubin: 0.9 mg/dL (ref 0.3–1.2)
Total Protein: 7.1 g/dL (ref 6.5–8.1)

## 2022-09-06 LAB — URINALYSIS, MICROSCOPIC (REFLEX)

## 2022-09-06 LAB — PREGNANCY, URINE: Preg Test, Ur: POSITIVE — AB

## 2022-09-06 LAB — LIPASE, BLOOD: Lipase: 23 U/L (ref 11–51)

## 2022-09-06 MED ORDER — LACTATED RINGERS IV BOLUS
1000.0000 mL | Freq: Once | INTRAVENOUS | Status: AC
  Administered 2022-09-06: 1000 mL via INTRAVENOUS

## 2022-09-06 MED ORDER — NITROFURANTOIN MONOHYD MACRO 100 MG PO CAPS: 100.0000 mg | ORAL_CAPSULE | Freq: Two times a day (BID) | ORAL | 0 refills | Status: DC

## 2022-09-06 MED ORDER — DICYCLOMINE HCL 10 MG PO CAPS
20.0000 mg | ORAL_CAPSULE | Freq: Once | ORAL | Status: DC
  Filled 2022-09-06: qty 2

## 2022-09-06 MED ORDER — LACTATED RINGERS IV BOLUS: 500.0000 mL | Freq: Once | INTRAVENOUS | Status: DC

## 2022-09-06 MED ORDER — PROMETHAZINE HCL 25 MG/ML IJ SOLN
INTRAMUSCULAR | Status: AC
  Filled 2022-09-06: qty 1

## 2022-09-06 MED ORDER — SODIUM CHLORIDE 0.9 % IV SOLN
12.5000 mg | INTRAVENOUS | Status: AC
  Administered 2022-09-06: 12.5 mg via INTRAVENOUS
  Filled 2022-09-06: qty 0.5

## 2022-09-06 MED ORDER — ACETAMINOPHEN 500 MG PO TABS
1000.0000 mg | ORAL_TABLET | Freq: Once | ORAL | Status: DC
  Filled 2022-09-06: qty 2

## 2022-09-06 MED ORDER — PYRIDOXINE HCL 100 MG/ML IJ SOLN
50.0000 mg | Freq: Once | INTRAMUSCULAR | Status: AC
  Administered 2022-09-06: 50 mg via INTRAVENOUS
  Filled 2022-09-06: qty 1

## 2022-09-06 NOTE — ED Triage Notes (Signed)
Pt arrives pov, slow gait, c/o LLQ pain and inability to hold down fluids or nausea meds. Pt pregnant, due May 2024

## 2022-09-06 NOTE — Discharge Instructions (Signed)
You are being treated for bacteria in your urine with an antibiotic.  Please take this for the entire course.  A urine culture is being run and we will let you know if you need to be placed on a different antibiotic.  Take the prescribed occasions by your OB/GYN.  Return to the ER for any new or concerning symptoms.  I given you the information for the [facility] which I would recommend going to at [facility] in [place]

## 2022-09-06 NOTE — ED Provider Notes (Signed)
[place] Provider Note   CSN: [id number] Arrival date & time: 09/06/22  1626     History {Add pertinent medical, surgical, social history, OB history to HPI:1} Chief Complaint  Patient presents with   Abdominal Pain    Veronica Pittman is a 21 y.o. female.   Abdominal Pain         Home Medications Prior to Admission medications   Medication Sig Start Date End Date Taking? Authorizing Provider  dicyclomine (BENTYL) 20 MG tablet Take 1 tablet (20 mg total) by mouth 2 (two) times daily. 01/28/22   Couture, Cortni S, PA-C  doxylamine, Sleep, (UNISOM) 25 MG tablet Take 1 tablet (25 mg total) by mouth at bedtime as needed. 08/14/22   Elgie Congo, MD  famotidine (PEPCID) 20 MG tablet Take 1 tablet (20 mg total) by mouth daily. 08/27/22 09/26/22  Jorje Guild, NP  Prenatal Vit-Fe Fumarate-FA (PRENATAL COMPLETE) 14-0.4 MG TABS Take 1 tablet by mouth daily. 08/14/22 10/13/22  Elgie Congo, MD  promethazine (PHENERGAN) 25 MG tablet Take 1 tablet (25 mg total) by mouth every 6 (six) hours as needed for nausea or vomiting. 08/27/22   Jorje Guild, NP  pyridOXINE (VITAMIN B6) 25 MG tablet Take 1 tablet (25 mg total) by mouth every 8 (eight) hours as needed (nausea and vomiting). 08/14/22   Elgie Congo, MD      Allergies    Patient has no known allergies.    Review of Systems   Review of Systems  Gastrointestinal:  Positive for abdominal pain.    Physical Exam Updated Vital Signs BP 117/80   Pulse 96   Temp 98.6 F (37 C)   Resp 18   Ht 5\' 2"  (1.575 m)   Wt 51.5 kg   LMP  (LMP Unknown)   SpO2 99%   BMI 20.78 kg/m  Physical Exam  ED Results / Procedures / Treatments   Labs (all labs ordered are listed, but only abnormal results are displayed) Labs Reviewed  PREGNANCY, URINE - Abnormal; Notable for the following components:      Result Value   Preg Test, Ur POSITIVE (*)    All other components within normal limits   LIPASE, BLOOD  COMPREHENSIVE METABOLIC PANEL  CBC  URINALYSIS, ROUTINE W REFLEX MICROSCOPIC    EKG None  Radiology No results found.  Procedures Procedures  {Document cardiac monitor, telemetry assessment procedure when appropriate:1}  Medications Ordered in ED Medications - No data to display  ED Course/ Medical Decision Making/ A&P Clinical Course as of 09/06/22 1848  Mon Sep 06, 2022  1758 Reviewed [place] from last visit: 9/23 EGA at this time ~ 11w 4d IMPRESSION: 1. Single viable IUP with estimated gestational age of [redacted] weeks and 1 day and normal fetal heart rate of 147 beats per minute. No acute findings. 2. Probable degenerating corpus luteum cyst in the left ovary incidentally noted.   Electronically Signed By: Vinnie Langton M.D. On: 08/14/2022 09:23   [WF]    Clinical Course User Index [WF] Tedd Sias, PA                           Medical Decision Making Amount and/or Complexity of Data Reviewed Labs: ordered.   ***  {Document critical care time when appropriate:1} {Document review of labs and clinical decision tools ie heart score, Chads2Vasc2 etc:1}  {Document your independent review of radiology images, and any outside records:1} {  Document your discussion with family members, caretakers, and with consultants:1} {Document social determinants of health affecting pt's care:1} {Document your decision making why or why not admission, treatments were needed:1} Final Clinical Impression(s) / ED Diagnoses Final diagnoses:  None    Rx / DC Orders ED Discharge Orders     None

## 2022-09-07 LAB — URINE CULTURE: Culture: NO GROWTH

## 2022-09-08 ENCOUNTER — Other Ambulatory Visit: Payer: Self-pay

## 2022-09-08 ENCOUNTER — Emergency Department (HOSPITAL_COMMUNITY)
Admission: EM | Admit: 2022-09-08 | Discharge: 2022-09-08 | Discharge status: Against Medical Advice | Payer: Medicaid Other | Attending: Emergency Medicine | Admitting: Emergency Medicine

## 2022-09-08 ENCOUNTER — Encounter (HOSPITAL_COMMUNITY): Payer: Self-pay

## 2022-09-08 DIAGNOSIS — R109 Unspecified abdominal pain: Secondary | ICD-10-CM | POA: Insufficient documentation

## 2022-09-08 DIAGNOSIS — O26891 Other specified pregnancy related conditions, first trimester: Secondary | ICD-10-CM | POA: Diagnosis not present

## 2022-09-08 DIAGNOSIS — Z5321 Procedure and treatment not carried out due to patient leaving prior to being seen by health care provider: Secondary | ICD-10-CM | POA: Insufficient documentation

## 2022-09-08 DIAGNOSIS — Z3A11 11 weeks gestation of pregnancy: Secondary | ICD-10-CM | POA: Diagnosis not present

## 2022-09-08 DIAGNOSIS — O219 Vomiting of pregnancy, unspecified: Secondary | ICD-10-CM | POA: Diagnosis present

## 2022-09-08 LAB — I-STAT BETA HCG BLOOD, ED (MC, WL, AP ONLY): I-stat hCG, quantitative: >2000 m[IU]/mL — ABNORMAL HIGH (ref <5)

## 2022-09-08 LAB — COMPREHENSIVE METABOLIC PANEL
ALT: 28 U/L (ref 0–44)
AST: 25 U/L (ref 15–41)
Albumin: 4.1 g/dL (ref 3.5–5.0)
Alkaline Phosphatase: 58 U/L (ref 38–126)
Anion gap: 11 (ref 5–15)
BUN: 10 mg/dL (ref 6–20)
CO2: 23 mmol/L (ref 22–32)
Calcium: 9.6 mg/dL (ref 8.9–10.3)
Chloride: 101 mmol/L (ref 98–111)
Creatinine, Ser: 0.64 mg/dL (ref 0.44–1.00)
GFR, Estimated: >60 mL/min (ref >60)
Glucose, Bld: 107 mg/dL — ABNORMAL HIGH (ref 70–99)
Potassium: 3.6 mmol/L (ref 3.5–5.1)
Sodium: 135 mmol/L (ref 135–145)
Total Bilirubin: 1.1 mg/dL (ref 0.3–1.2)
Total Protein: 7.7 g/dL (ref 6.5–8.1)

## 2022-09-08 LAB — LIPASE, BLOOD: Lipase: 25 U/L (ref 11–51)

## 2022-09-08 LAB — CBC
HCT: 42.6 % (ref 36.0–46.0)
Hemoglobin: 15.3 g/dL — ABNORMAL HIGH (ref 12.0–15.0)
MCH: 31.3 pg (ref 26.0–34.0)
MCHC: 35.9 g/dL (ref 30.0–36.0)
MCV: 87.1 fL (ref 80.0–100.0)
Platelets: 251 10*3/uL (ref 150–400)
RBC: 4.89 MIL/uL (ref 3.87–5.11)
RDW: 12.2 % (ref 11.5–15.5)
WBC: 6.6 10*3/uL (ref 4.0–10.5)
nRBC: 0 % (ref 0.0–0.2)

## 2022-09-08 NOTE — ED Triage Notes (Addendum)
Patient said she is [redacted] weeks pregnant. Has been vomiting every day. Said she was prescribed zofran, promethazine, famotidine but vomits it up. Abdominal pain that begins on the left side that travels to the middle of her stomach. This would be patients 2nd baby. She also thinks she has a cyst on her left ovary.

## 2022-09-08 NOTE — ED Provider Triage Note (Signed)
Emergency Medicine Provider Triage Evaluation Note  Veronica Pittman , a 21 y.o. female  was evaluated in triage.  Pt complains of vomiting. Pt currently [redacted] weeks pregnant, here with nausea and vomiting.  No fever, productive cough  Review of Systems  Positive: As above Negative: As above  Physical Exam  BP 115/81   Pulse (!) 129   Temp 98.7 F (37.1 C) (Oral)   Resp 18   Ht 5\' 2"  (1.575 m)   Wt 51.3 kg   LMP  (LMP Unknown)   SpO2 97%   BMI 20.67 kg/m  Gen:   Awake, no distress   Resp:  Normal effort  MSK:   Moves extremities without difficulty  Other:    Medical Decision Making  Medically screening exam initiated at 10:41 AM.  Appropriate orders placed.  Veronica Pittman was informed that the remainder of the evaluation will be completed by another provider, this initial triage assessment does not replace that evaluation, and the importance of remaining in the ED until their evaluation is complete.     Domenic Moras, PA-C 09/08/22 1041

## 2022-09-22 DIAGNOSIS — F12288 Cannabis dependence with other cannabis-induced disorder: Secondary | ICD-10-CM | POA: Insufficient documentation

## 2022-09-22 DIAGNOSIS — O211 Hyperemesis gravidarum with metabolic disturbance: Secondary | ICD-10-CM | POA: Insufficient documentation

## 2022-10-03 ENCOUNTER — Encounter (HOSPITAL_COMMUNITY): Payer: Self-pay | Admitting: Obstetrics & Gynecology

## 2022-10-03 ENCOUNTER — Inpatient Hospital Stay (HOSPITAL_COMMUNITY)
Admission: AD | Admit: 2022-10-03 | Discharge: 2022-10-04 | Disposition: A | Discharge status: Home / Self Care | Payer: Medicaid Other | Attending: Obstetrics & Gynecology | Admitting: Obstetrics & Gynecology

## 2022-10-03 DIAGNOSIS — R079 Chest pain, unspecified: Secondary | ICD-10-CM | POA: Insufficient documentation

## 2022-10-03 DIAGNOSIS — Z79899 Other long term (current) drug therapy: Secondary | ICD-10-CM | POA: Diagnosis not present

## 2022-10-03 DIAGNOSIS — O26892 Other specified pregnancy related conditions, second trimester: Secondary | ICD-10-CM | POA: Insufficient documentation

## 2022-10-03 DIAGNOSIS — Z3A15 15 weeks gestation of pregnancy: Secondary | ICD-10-CM | POA: Insufficient documentation

## 2022-10-03 DIAGNOSIS — O210 Mild hyperemesis gravidarum: Secondary | ICD-10-CM | POA: Insufficient documentation

## 2022-10-03 LAB — URINALYSIS, ROUTINE W REFLEX MICROSCOPIC
Glucose, UA: NEGATIVE mg/dL
Hgb urine dipstick: NEGATIVE
Ketones, ur: 20 mg/dL — AB
Nitrite: NEGATIVE
Protein, ur: 100 mg/dL — AB
Specific Gravity, Urine: 1.023 (ref 1.005–1.030)
Squamous Epithelial / HPF: >50 — ABNORMAL HIGH (ref 0–5)
pH: 5 (ref 5.0–8.0)

## 2022-10-03 MED ORDER — M.V.I. ADULT IV INJ
Freq: Once | INTRAVENOUS | Status: AC
  Filled 2022-10-03: qty 10

## 2022-10-03 MED ORDER — LACTATED RINGERS IV BOLUS
1000.0000 mL | Freq: Once | INTRAVENOUS | Status: AC
  Administered 2022-10-03: 1000 mL via INTRAVENOUS

## 2022-10-03 MED ORDER — FAMOTIDINE IN NACL 20-0.9 MG/50ML-% IV SOLN
20.0000 mg | Freq: Once | INTRAVENOUS | Status: AC
  Administered 2022-10-04: 20 mg via INTRAVENOUS
  Filled 2022-10-03: qty 50

## 2022-10-03 MED ORDER — SODIUM CHLORIDE 0.9 % IV SOLN
8.0000 mg | Freq: Once | INTRAVENOUS | Status: AC
  Administered 2022-10-03: 8 mg via INTRAVENOUS
  Filled 2022-10-03: qty 4

## 2022-10-03 NOTE — MAU Note (Signed)
.  Veronica Pittman is a 22 y.o. at [redacted]w[redacted]d here in MAU reporting: chest pain that hurts when she breathes and when she swallows (9/10). She reports that pain began this morning and describes the pain as "tight and pressure". She also reports N/V that has been on-going, had 5 episodes of emesis today. She took her nausea medications last around 1400. She reports that she hasn't been able to keep down food or liquids for a couple of months. Denies VB or LOF.  LMP: N/A Onset of complaint: Today Pain score: 9/10 Vitals:   10/03/22 2227  BP: (!) 102/52  Pulse: (!) 153  Resp: 18  Temp: 97.7 F (36.5 C)  SpO2: 100%     FHT:152 Lab orders placed from triage:  UA

## 2022-10-03 NOTE — MAU Provider Note (Signed)
History     CSN: [id number]  Arrival date and time: 10/03/22 2215   Event Date/Time   First Provider Initiated Contact with Patient 10/03/22 2244      Chief Complaint  Patient presents with   Chest Pain   Emesis   Nausea   Veronica Pittman is a 21 y.o. G2P1001 at [redacted]w[redacted]d who receives care at [facility] in [place].  She presents today for Chest Pain, Emesis, and Nausea. She reports she has been experiencing nausea and vomiting since the beginning of pregnancy.  Patient's mother, Veronica Pittman, states patient has been staying with her the past 3 days and unresponsive to ginger products, electrolytes, water, and juices.  She reports the patient has been unable to keep down rice, crackers, and peanut butter and last vomited while checking in.  She states about one hour ago the patient started to c/o chest pain and tightness.  Patient mother reports she is taking "a bunch of stuff" and has been taking it routinely.  She reports that patient has been unable to keep these things down.  Patient denies vaginal concerns including discharge, bleeding, or leaking.  She further denies abdominal pain.  Patient mother states patient has thrown up multiple times today. Patient endorses daily marijuana usage prior to pregnancy.     Veronica Pittman is a 21 y.o. at [redacted]w[redacted]d here in MAU reporting: chest pain that hurts when she breathes and when she swallows (9/10). She reports that pain began this morning and describes the pain as "tight and pressure". She also reports N/V that has been on-going, had 5 episodes of emesis today. She took her nausea medications last around 1400. She reports that she hasn't been able to keep down food or liquids for a couple of months. Denies VB or LOF.  LMP: N/A Onset of complaint: Today Pain score: 9/10 Vitals:   10/03/22 2227 BP: (!) 102/52 Pulse: (!) 153 Resp: 18 Temp: 97.7 F (36.5 C) SpO2: 100%    FHT:152 Lab orders placed from triage:  UA       {GYN/OB  [id number]  Past Medical History:  Diagnosis Date   Medical history non-contributory     Past Surgical History:  Procedure Laterality Date   NO PAST SURGERIES      History reviewed. No pertinent family history.  Social History   Tobacco Use   Smoking status: Never   Smokeless tobacco: Never  Vaping Use   Vaping Use: Never used  Substance Use Topics   Alcohol use: No   Drug use: Not Currently    Allergies: No Known Allergies  Medications Prior to Admission  Medication Sig Dispense Refill Last Dose   dicyclomine (BENTYL) 20 MG tablet Take 1 tablet (20 mg total) by mouth 2 (two) times daily. 20 tablet 0 10/03/2022   doxylamine, Sleep, (UNISOM) 25 MG tablet Take 1 tablet (25 mg total) by mouth at bedtime as needed. 30 tablet 0 10/03/2022   nitrofurantoin, macrocrystal-monohydrate, (MACROBID) 100 MG capsule Take 1 capsule (100 mg total) by mouth 2 (two) times daily. 10 capsule 0 10/03/2022   promethazine (PHENERGAN) 25 MG tablet Take 1 tablet (25 mg total) by mouth every 6 (six) hours as needed for nausea or vomiting. 30 tablet 0 10/03/2022   pyridOXINE (VITAMIN B6) 25 MG tablet Take 1 tablet (25 mg total) by mouth every 8 (eight) hours as needed (nausea and vomiting). 60 tablet 0 10/03/2022   famotidine (PEPCID) 20 MG tablet Take 1 tablet (20 mg total) by mouth  daily. 30 tablet 0    Prenatal Vit-Fe Fumarate-FA (PRENATAL COMPLETE) 14-0.4 MG TABS Take 1 tablet by mouth daily. 60 tablet 1     Review of Systems Physical Exam   Blood pressure 104/74, pulse (!) 144, temperature 97.7 F (36.5 C), temperature source Oral, resp. rate 18, height 5\' 2"  (1.575 m), weight 46.1 kg, SpO2 100 %, unknown if currently breastfeeding.  Physical Exam  MAU Course  Procedures  MDM ***  Assessment and Plan  ***  10/03/2022, 10:44 PM

## 2022-10-04 DIAGNOSIS — O210 Mild hyperemesis gravidarum: Secondary | ICD-10-CM

## 2022-10-04 DIAGNOSIS — Z3A15 15 weeks gestation of pregnancy: Secondary | ICD-10-CM | POA: Diagnosis not present

## 2022-10-04 LAB — CBC WITH DIFFERENTIAL/PLATELET
Abs Immature Granulocytes: 0.08 10*3/uL — ABNORMAL HIGH (ref 0.00–0.07)
Basophils Absolute: 0.1 10*3/uL (ref 0.0–0.1)
Basophils Relative: 1 %
Eosinophils Absolute: 0 10*3/uL (ref 0.0–0.5)
Eosinophils Relative: 0 %
HCT: 46.5 % — ABNORMAL HIGH (ref 36.0–46.0)
Hemoglobin: 16.1 g/dL — ABNORMAL HIGH (ref 12.0–15.0)
Immature Granulocytes: 1 %
Lymphocytes Relative: 20 %
Lymphs Abs: 1.4 10*3/uL (ref 0.7–4.0)
MCH: 30.8 pg (ref 26.0–34.0)
MCHC: 34.6 g/dL (ref 30.0–36.0)
MCV: 88.9 fL (ref 80.0–100.0)
Monocytes Absolute: 0.7 10*3/uL (ref 0.1–1.0)
Monocytes Relative: 11 %
Neutro Abs: 4.8 10*3/uL (ref 1.7–7.7)
Neutrophils Relative %: 67 %
Platelets: 428 10*3/uL — ABNORMAL HIGH (ref 150–400)
RBC: 5.23 MIL/uL — ABNORMAL HIGH (ref 3.87–5.11)
RDW: 12.4 % (ref 11.5–15.5)
WBC: 7.1 10*3/uL (ref 4.0–10.5)
nRBC: 0 % (ref 0.0–0.2)

## 2022-10-04 LAB — COMPREHENSIVE METABOLIC PANEL
ALT: 341 U/L — ABNORMAL HIGH (ref 0–44)
AST: 161 U/L — ABNORMAL HIGH (ref 15–41)
Albumin: 3.7 g/dL (ref 3.5–5.0)
Alkaline Phosphatase: 78 U/L (ref 38–126)
Anion gap: 17 — ABNORMAL HIGH (ref 5–15)
BUN: 16 mg/dL (ref 6–20)
CO2: 19 mmol/L — ABNORMAL LOW (ref 22–32)
Calcium: 10.1 mg/dL (ref 8.9–10.3)
Chloride: 100 mmol/L (ref 98–111)
Creatinine, Ser: 0.9 mg/dL (ref 0.44–1.00)
GFR, Estimated: >60 mL/min (ref >60)
Glucose, Bld: 134 mg/dL — ABNORMAL HIGH (ref 70–99)
Potassium: 3.4 mmol/L — ABNORMAL LOW (ref 3.5–5.1)
Sodium: 136 mmol/L (ref 135–145)
Total Bilirubin: 2.6 mg/dL — ABNORMAL HIGH (ref 0.3–1.2)
Total Protein: 7.9 g/dL (ref 6.5–8.1)

## 2022-10-04 LAB — LIPASE, BLOOD: Lipase: 31 U/L (ref 11–51)

## 2022-10-04 MED ORDER — PREDNISONE 20 MG PO TABS: 40.0000 mg | ORAL_TABLET | Freq: Every day | ORAL | 0 refills | Status: AC

## 2022-10-04 MED ORDER — METHYLPREDNISOLONE SODIUM SUCC 125 MG IJ SOLR
125.0000 mg | Freq: Once | INTRAMUSCULAR | Status: AC
  Administered 2022-10-04: 125 mg via INTRAVENOUS
  Filled 2022-10-04: qty 2

## 2023-09-25 IMAGING — US US PELVIS COMPLETE TRANSABD/TRANSVAG W DUPLEX AND/OR DOPPLER
1 series · 13 of 25 positions shown · non-contrast
Comparison: October 12, 2018

CLINICAL DATA: Left lower quadrant abdominal pain and nausea and
vomiting.

EXAM:
TRANSABDOMINAL AND TRANSVAGINAL ULTRASOUND OF PELVIS
DOPPLER ULTRASOUND OF OVARIES
TECHNIQUE: Both transabdominal and transvaginal ultrasound examinations of the
pelvis were performed. Transabdominal technique was performed for
global imaging of the pelvis including uterus, ovaries, adnexal
regions, and pelvic cul-de-sac.
It was necessary to proceed with endovaginal exam following the
transabdominal exam to visualize the adnexa. Color and duplex
Doppler ultrasound was utilized to evaluate blood flow to the
ovaries.

[Series 1: us pelvis complete transabd/transvag w duplex and/ · 13 of 122 slices shown]
[im 1/122]
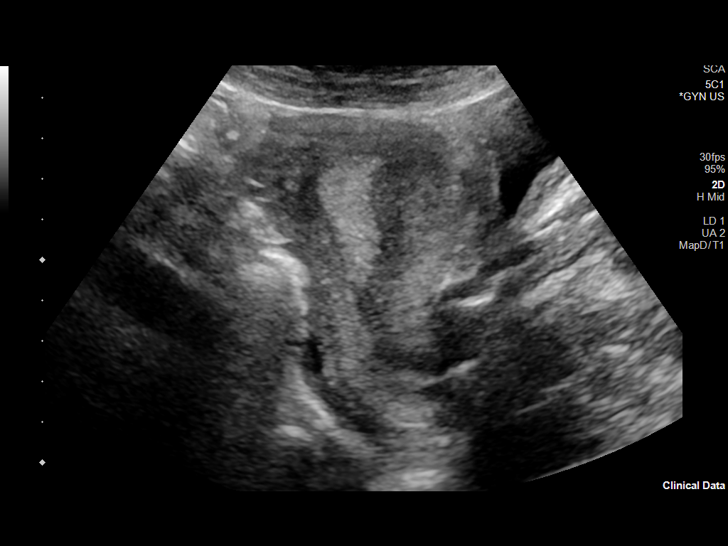
[im 11/122]
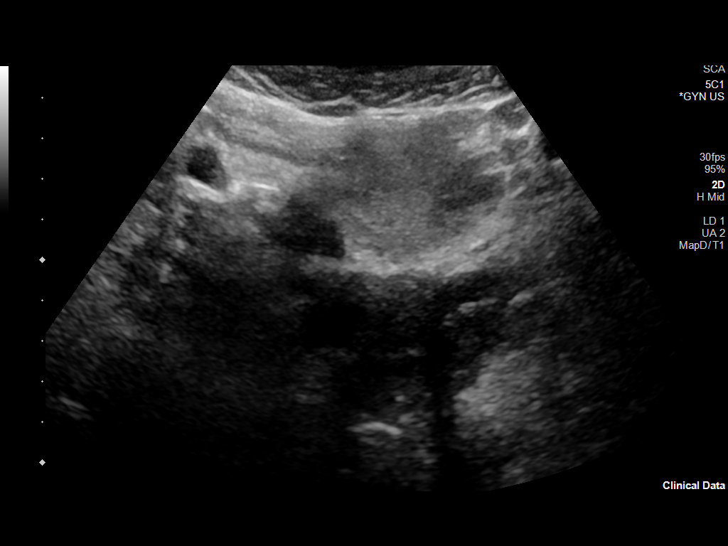
[im 21/122]
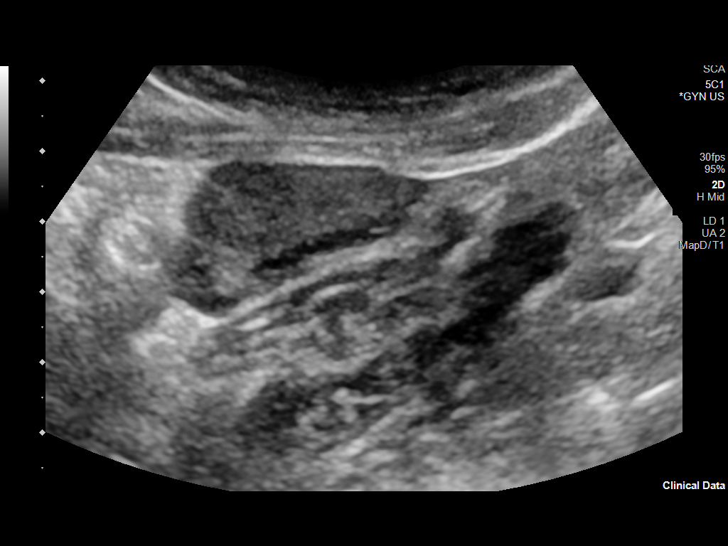
[im 31/122]
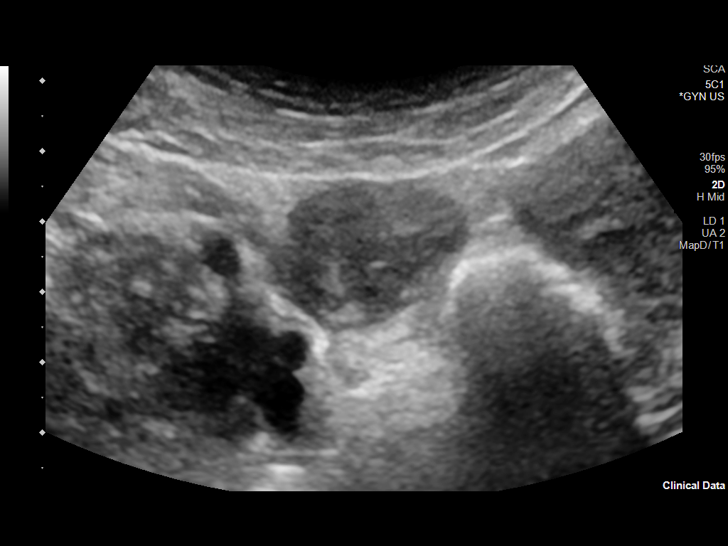
[im 41/122]
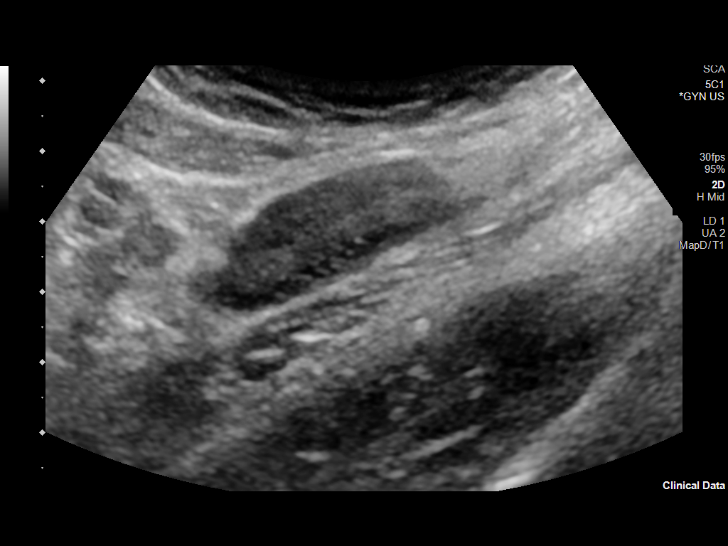
[im 51/122]
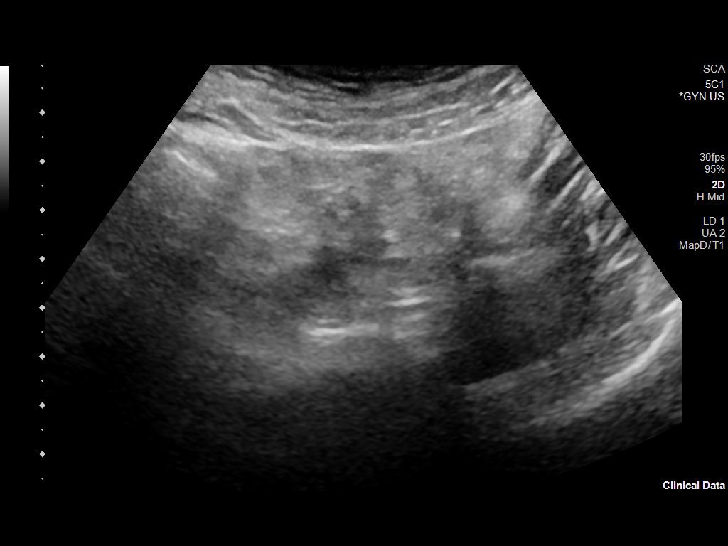
[im 61/122]
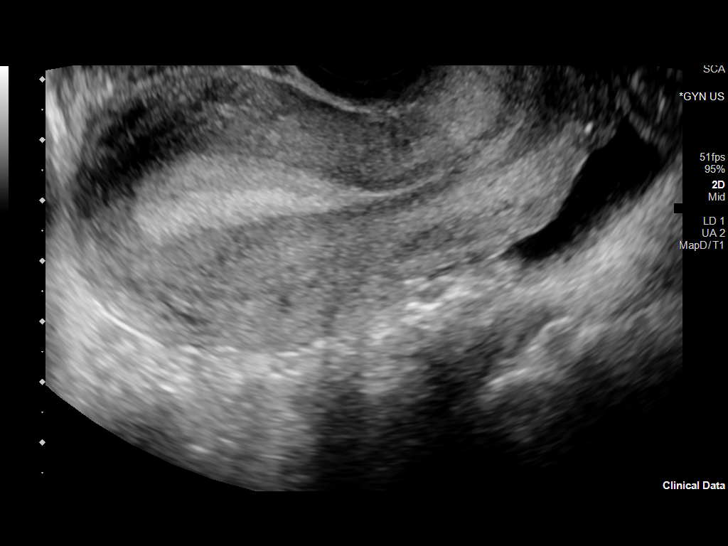
[im 71/122]
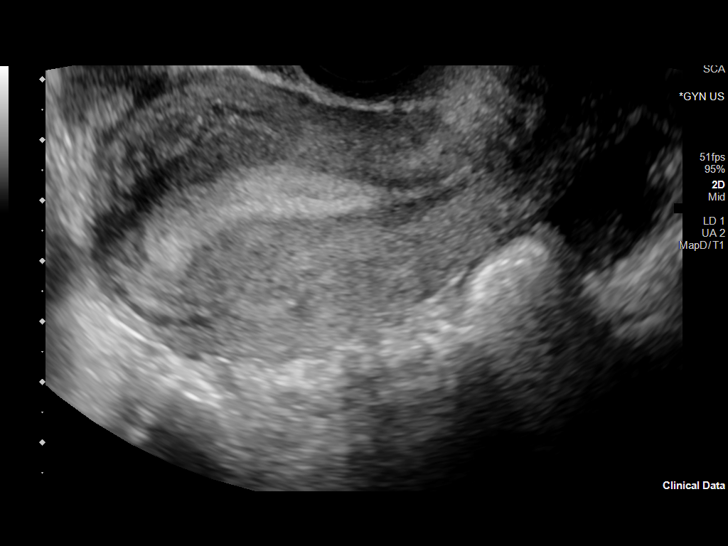
[im 81/122]
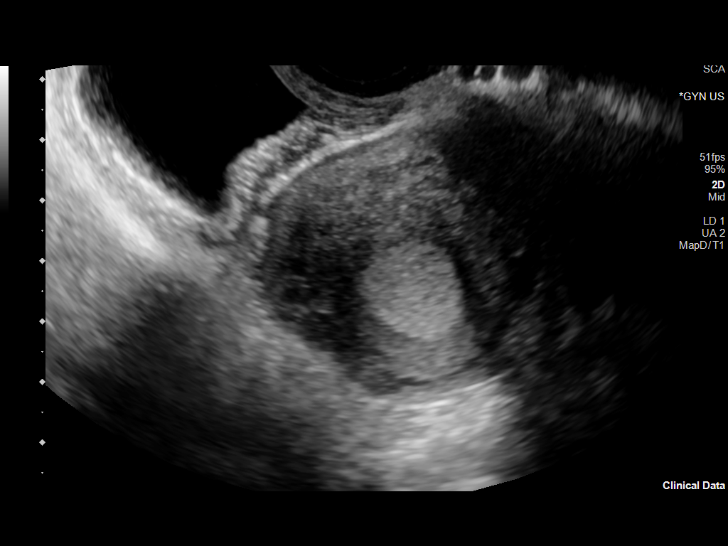
[im 91/122]
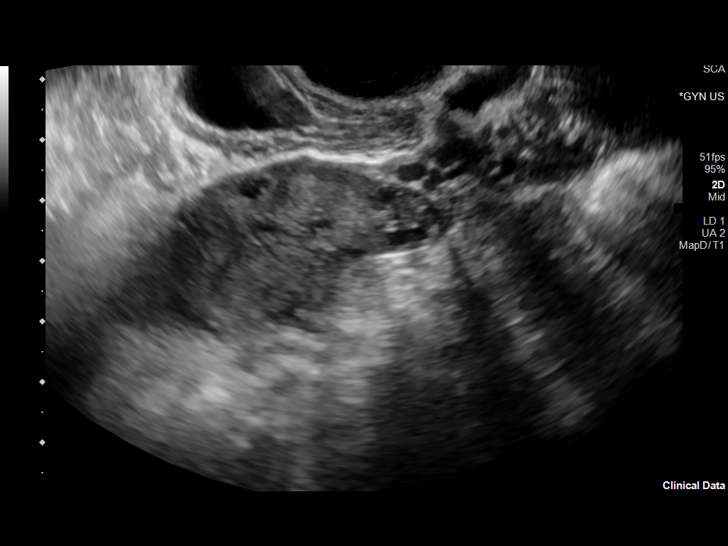
[im 101/122]
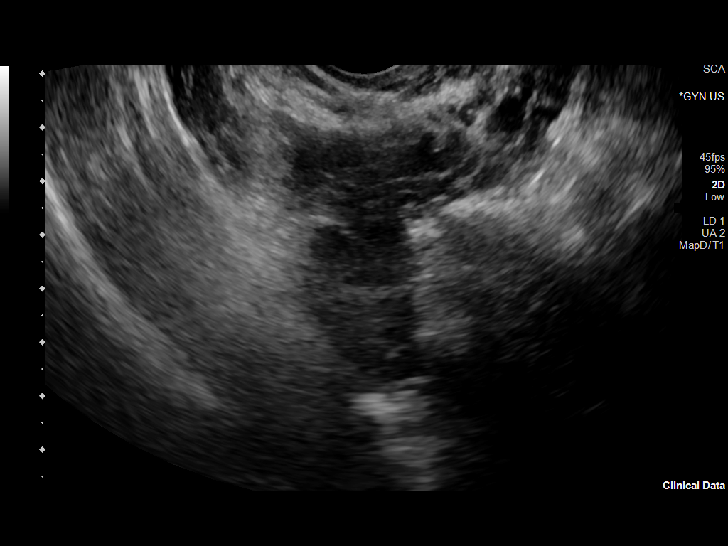
[im 111/122]
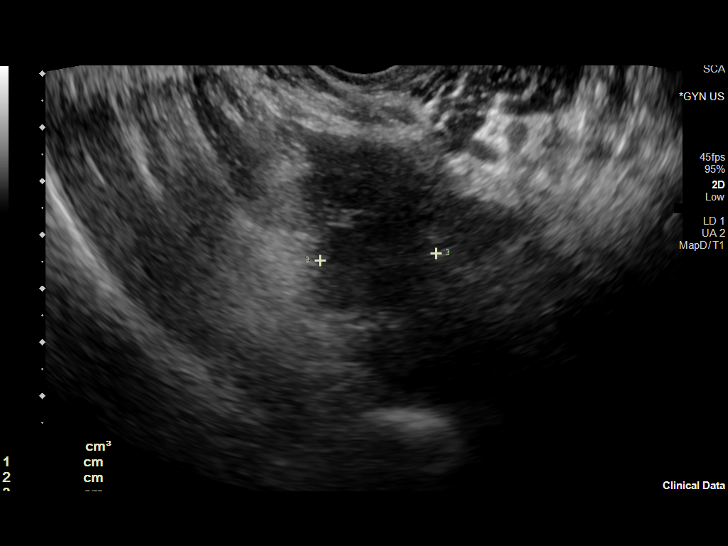
[im 122/122]
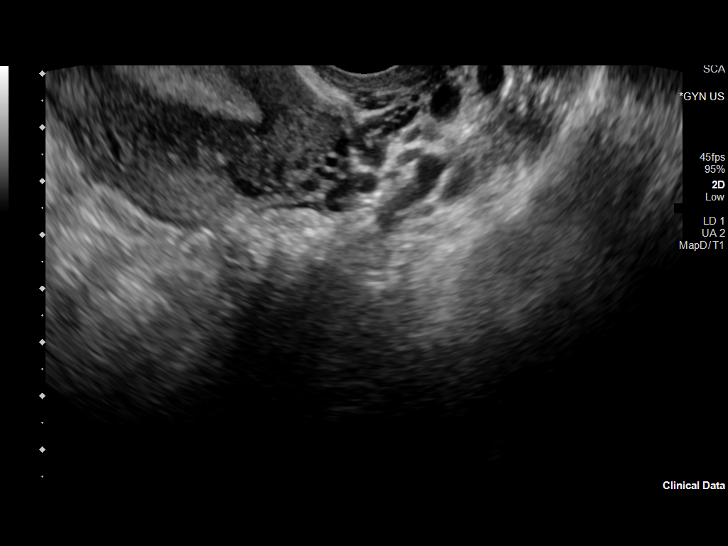

[13 of 25 positions shown; findings below may reference images not displayed]

FINDINGS: Uterus

Measurements: 8.8 x 4.9 x 5.7 cm = volume: 128 mL. No fibroids or
other mass visualized.

Endometrium

Thickness: 9.6 mm.  No focal abnormality visualized.

Right ovary

Measurements: 4.1 x 2.3 x 3.3 cm = volume: 16 mL. Normal
appearance/no adnexal mass.

Left ovary

Measurements: 3.6 x 1.6 x 2.6 cm = volume: 7.8 mL. Normal
appearance/no adnexal mass.

Pulsed Doppler evaluation of both ovaries demonstrates normal
low-resistance arterial and venous waveforms.

Other findings

Trace free fluid.
IMPRESSION: Trace free fluid, with uncertain clinical significance, otherwise
normal pelvic ultrasound.

## 2023-09-26 IMAGING — CT CT ABD-PELV W/ CM
2 of 4 series · 15 of 46 positions shown, 17 images · IV contrast (agent unspecified)
Comparison: CT abdomen and pelvis 10/09/2018. Pelvic ultrasound
01/27/2022.

CLINICAL DATA: Left lower quadrant abdominal pain.

EXAM:
CT ABDOMEN AND PELVIS WITH CONTRAST
TECHNIQUE: Multidetector CT imaging of the abdomen and pelvis was performed
using the standard protocol following bolus administration of
intravenous contrast.

[Series 2: axial st · axial · 0.83mm/px · z∈[+482,+862]mm · 12 of 84 slices shown, 14 images]
[im 4/84  soft-tissue]
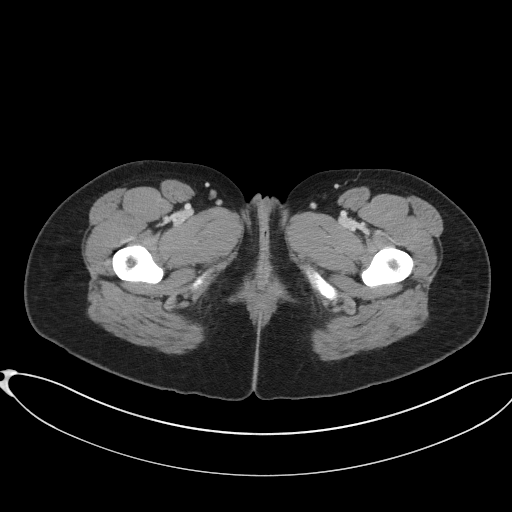
[im 4/84  bone]
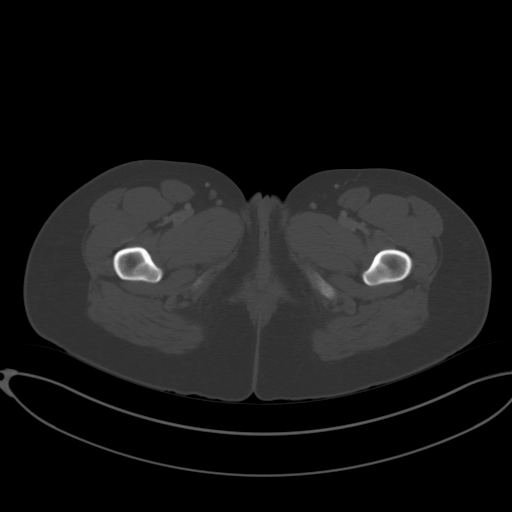
[im 11/84  soft-tissue]
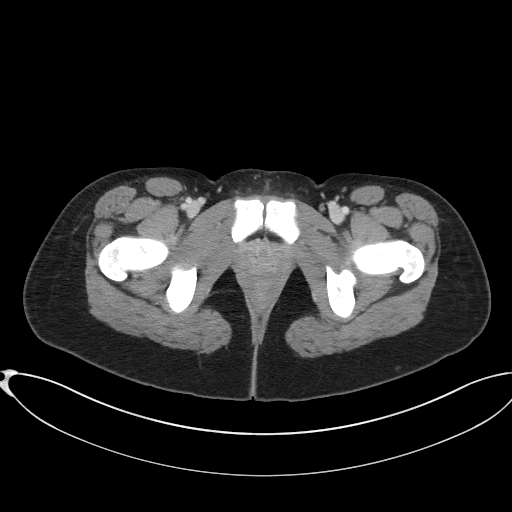
[im 18/84  soft-tissue]
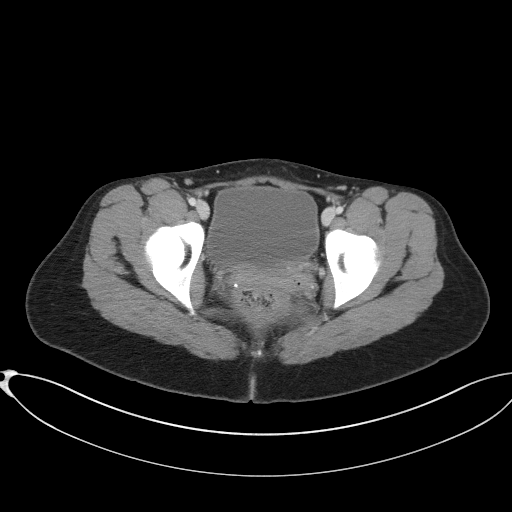
[im 25/84  soft-tissue]
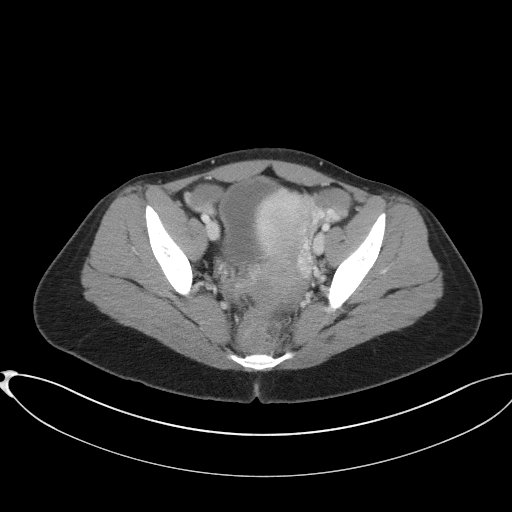
[im 32/84  soft-tissue]
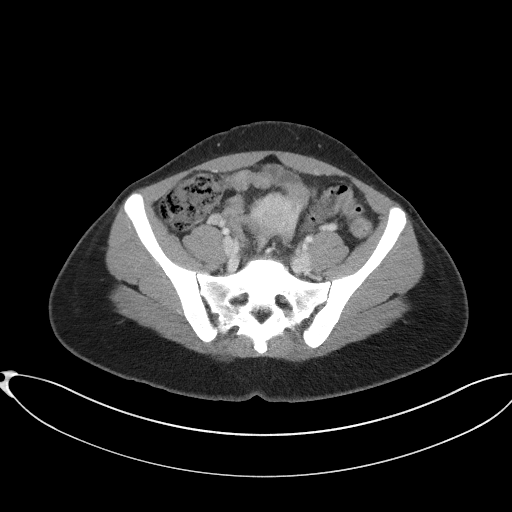
[im 39/84  soft-tissue]
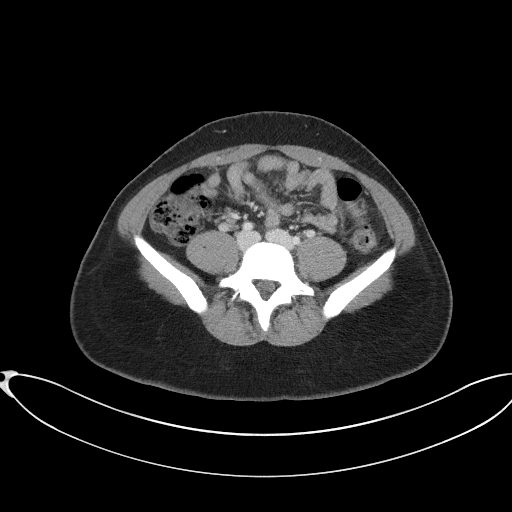
[im 45/84  soft-tissue]
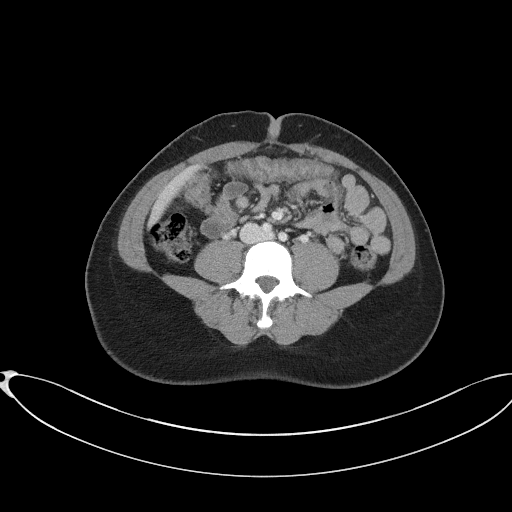
[im 52/84  soft-tissue]
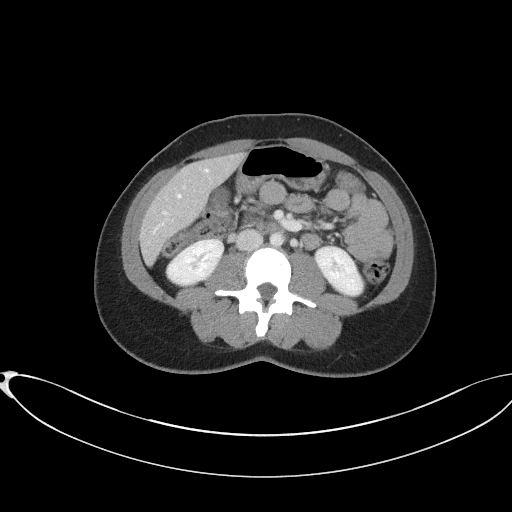
[im 59/84  soft-tissue]
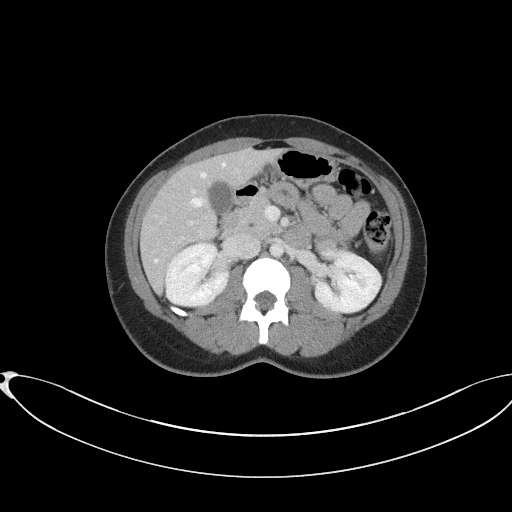
[im 59/84  bone]
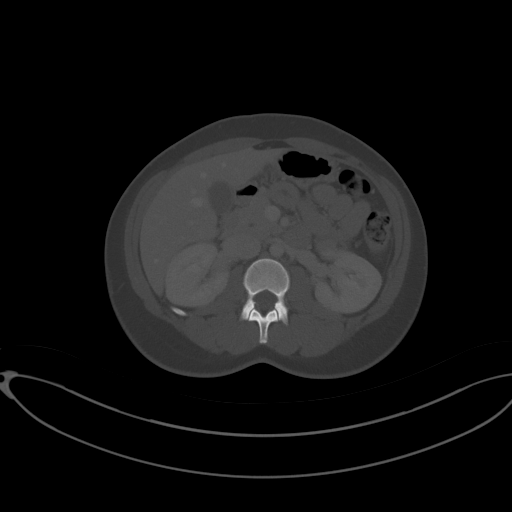
[im 66/84  soft-tissue]
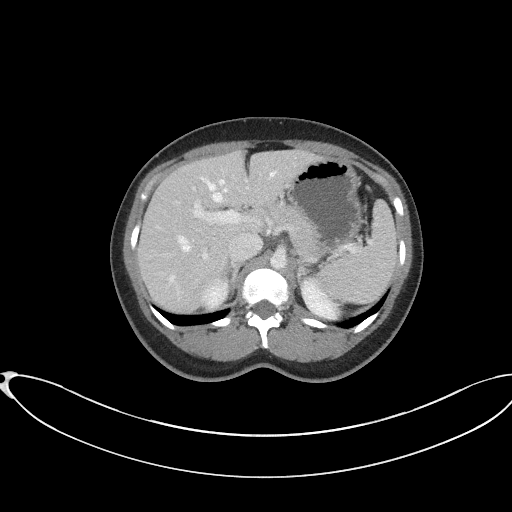
[im 73/84  soft-tissue]
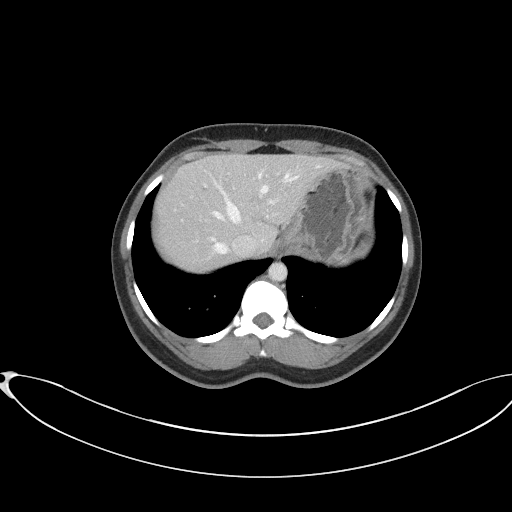
[im 80/84  soft-tissue]
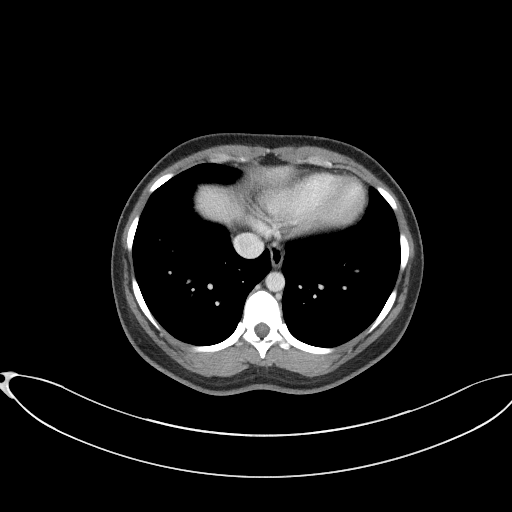

[Series 5: coronal st · coronal · 0.62mm/px · 3 of 73 slices shown]
[im 25/73  soft-tissue]
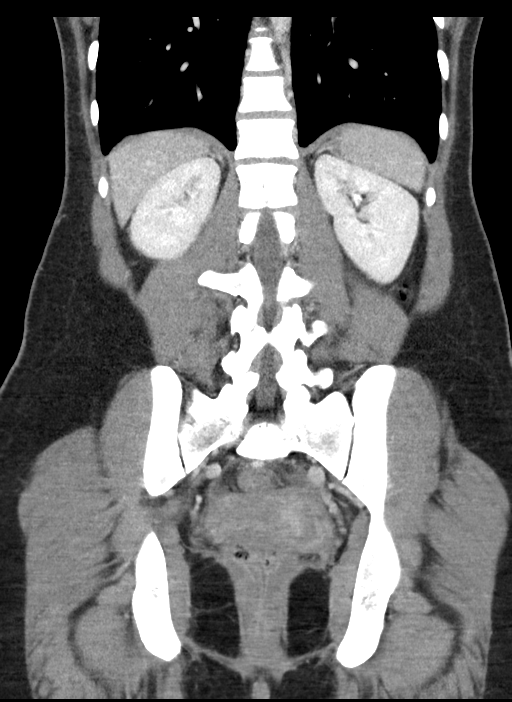
[im 33/73  soft-tissue]
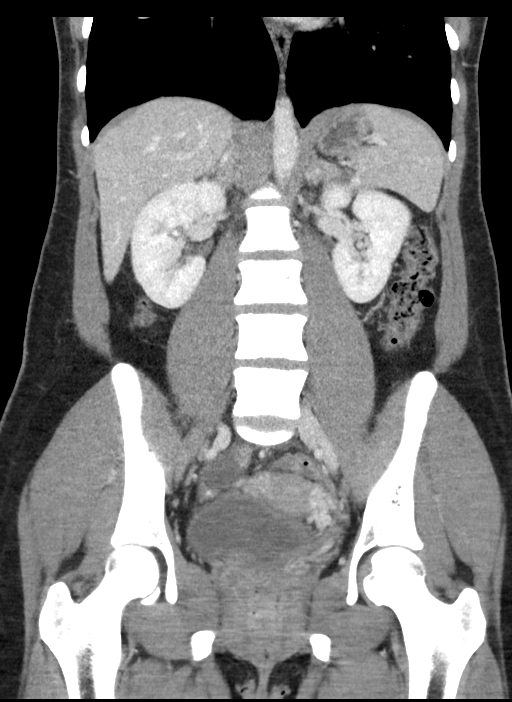
[im 41/73  soft-tissue]
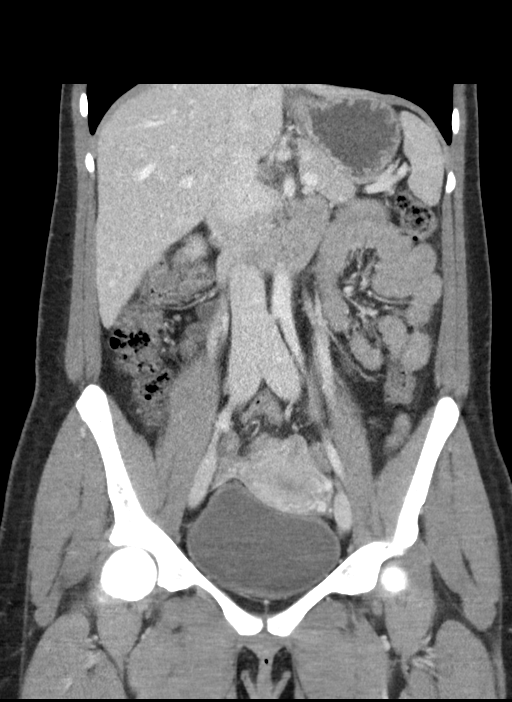

[15 of 46 positions shown; findings below may reference images not displayed]

RADIATION DOSE REDUCTION: This exam was performed according to the
departmental dose-optimization program which includes automated
exposure control, adjustment of the mA and/or kV according to
patient size and/or use of iterative reconstruction technique.

CONTRAST:  100mL OMNIPAQUE IOHEXOL 300 MG/ML  SOLN
FINDINGS: Lower chest: No acute abnormality.

Hepatobiliary: No focal liver abnormality is seen. No gallstones,
gallbladder wall thickening, or biliary dilatation.

Pancreas: Unremarkable. No pancreatic ductal dilatation or
surrounding inflammatory changes.

Spleen: Normal in size without focal abnormality.

Adrenals/Urinary Tract: Adrenal glands are unremarkable. Kidneys are
normal, without renal calculi, focal lesion, or hydronephrosis.
Bladder is unremarkable.

Stomach/Bowel: Stomach is within normal limits. Appendix appears
normal. No bowel obstruction or free air. There is rectosigmoid
colon wall thickening versus normal under distension.

Vascular/Lymphatic: No significant vascular findings are present. No
enlarged abdominal or pelvic lymph nodes.

Reproductive: There is a collapsing cyst or follicle in the right
ovary measuring 1.9 cm. Otherwise, the ovaries and uterus appear
within normal limits.

Other: There is a small amount of simple free fluid in the right
adnexa. There is no focal abdominal wall hernia.

Musculoskeletal: No acute or significant osseous findings.
IMPRESSION: 1. Collapsing cyst or follicle in the right ovary measuring 1.9 cm
with small amount of free fluid in the right adnexa.

2. Rectosigmoid colon wall thickening versus normal under distension
birth correlate for proctocolitis.

## 2023-11-23 HISTORY — PX: CHOLECYSTECTOMY: SHX55

## 2023-11-29 DIAGNOSIS — K8021 Calculus of gallbladder without cholecystitis with obstruction: Secondary | ICD-10-CM | POA: Insufficient documentation

## 2023-12-14 NOTE — Progress Notes (Deleted)
 New patient visit   Patient: Veronica Pittman   DOB: February 04, 2001   23 y.o. Female  MRN: [id number] Visit Date: 12/15/2023  Today's healthcare provider: Alfredia Ferguson, PA-C   No chief complaint on file.  Subjective    Demani Galperin is a 23 y.o. female who presents today as a new patient to establish care.   MRI MRCP w/o w/ contrast 12/02/23 1. Examination is generally somewhat limited by breath motion  artifact, particularly multiphasic contrast enhanced sequences.  2. Within this limitation, no acute findings of the abdomen to  explain upper abdominal pain or vomiting. Gallbladder sludge  described by prior ultrasound not well appreciated by MR. No  gallstones nor biliary ductal dilatation.  3. Multiple subtly T2 hyperintense, diffusion restricting liver  lesions with generally transient arterial hyperenhancement,  including a lesion centered in the caudate measuring 2.3 x 1.6 cm  seen on prior examination. 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 Although both  differential considerations are benign, there may be management and  surveillance implications for the diagnosis of hepatic adenoma.  Recommend hepatobiliary referral and follow-up Eovist enhanced MRI  in 6 months to ensure stability and more confidently distinguish  between these two entities. Please specify Eovist contrast at the  time of ordering to ensure its proper use.   [place] 11/27/23  1. Unusual appearance of the gallbladder, presumably filled with  tumefactive sludge. No imaging findings to suggest an acute  cholecystitis are noted at this time.  2. Common bile duct is normal in caliber measuring 2.8 mm.  3. 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 As   previously suggested, follow-up nonemergent outpatient abdominal MRI  with and without IV gadolinium is recommended to better evaluate  those lesions.   Past Medical History:  Diagnosis Date   Medical history non-contributory    Past Surgical History:  Procedure Laterality Date   NO PAST SURGERIES     Family Status  Relation Name Status   Mother  Alive   Father  Alive   Sister  Alive   Sister  Alive   Sister  Alive   Sister  Alive   Sister  Alive  No partnership data on file   No family history on file. Social History   Socioeconomic History   Marital status: Single    Spouse name: Not on file   Number of children: Not on file   Years of education: Not on file   Highest education level: Not on file  Occupational History   Not on file  Tobacco Use   Smoking status: Never   Smokeless tobacco: Never  Vaping Use   Vaping status: Never Used  Substance and Sexual Activity   Alcohol use: No   Drug use: Not Currently   Sexual activity: Yes    Birth control/protection: None  Other Topics Concern   Not on file  Social History Narrative   Not on file   Social Drivers of Health   Financial Resource Strain: Not on file  Food Insecurity: Low Risk  (03/25/2023)   Received from [facility]   Hunger Vital Sign    Worried About Running Out of Food in the Last Year: Never true    Ran Out of Food in the Last Year: Never true  Transportation Needs: Not on file (03/25/2023)  Physical Activity: Not on file  Stress: Not on file  Social Connections: Not on file   Outpatient Medications Prior to Visit  Medication Sig   doxylamine, Sleep, (UNISOM) 25 MG tablet Take 1 tablet (25 mg total) by mouth at bedtime as needed.   famotidine (PEPCID) 20 MG tablet Take 1 tablet (20 mg total) by mouth daily.   promethazine (PHENERGAN) 25 MG tablet Take 1 tablet (25 mg total) by mouth every 6 (six) hours as needed for nausea or vomiting.   pyridOXINE (VITAMIN B6) 25 MG tablet Take 1 tablet  (25 mg total) by mouth every 8 (eight) hours as needed (nausea and vomiting).   No facility-administered medications prior to visit.   No Known Allergies  Immunization History  Administered Date(s) Administered   DTaP 07/13/1999, 12/24/1999, 08/11/2000, 10/27/2000   HIB (PRP-OMP) 07/13/1999, 12/24/1999, 08/11/2000   HIB, Unspecified 07/13/1999, 12/24/1999, 08/11/2000   Hepatitis B, PED/ADOLESCENT 08/27/99, 07/13/1999, 12/24/1999   IPV 07/13/1999, 12/24/1999, 10/27/2000   MMR 10/27/2000   Pneumococcal-Unspecified 12/24/1999, 08/11/2000, 12/28/2000   Tdap 04/26/2016   Varicella 10/27/2000    Health Maintenance  Topic Date Due   HPV VACCINES (1 - 3-dose series) Never done   Hepatitis C Screening  Never done   Cervical Cancer Screening (Pap smear)  Never done   INFLUENZA VACCINE  Never done   COVID-19 Vaccine (1 - 2024-25 season) Never done   CHLAMYDIA SCREENING  08/29/2023   DTaP/Tdap/Td (5 - Td or Tdap) 04/26/2026   HIV Screening  Completed    Patient Care Team: Alm Bustard, MD as PCP - General (Obstetrics and Gynecology)  Review of Systems  {Insert previous labs (optional):23779} {See past labs  Heme  Chem  Endocrine  Serology  Results Review (optional):1}   Objective    There were no vitals taken for this visit. {Insert last BP/Wt (optional):23777}{See vitals history (optional):1}   Physical Exam ***  Depression Screen     No data to display         No results found for any visits on 12/15/23.  Assessment & Plan     There are no diagnoses linked to this encounter.   No follow-ups on file.      Alfredia Ferguson, PA-C  [facility] at [facility] [phone] (phone) [phone] (fax)  [facility]

## 2023-12-15 ENCOUNTER — Ambulatory Visit: Payer: Medicaid Other | Admitting: Physician Assistant

## 2024-03-05 ENCOUNTER — Inpatient Hospital Stay (HOSPITAL_COMMUNITY)

## 2024-03-05 ENCOUNTER — Inpatient Hospital Stay (HOSPITAL_COMMUNITY)
Admission: AD | Admit: 2024-03-05 | Discharge: 2024-03-05 | Disposition: A | Discharge status: Home / Self Care | Attending: Obstetrics & Gynecology | Admitting: Obstetrics & Gynecology

## 2024-03-05 ENCOUNTER — Encounter (HOSPITAL_COMMUNITY): Payer: Self-pay | Admitting: Obstetrics & Gynecology

## 2024-03-05 DIAGNOSIS — K5900 Constipation, unspecified: Secondary | ICD-10-CM | POA: Insufficient documentation

## 2024-03-05 DIAGNOSIS — O99281 Endocrine, nutritional and metabolic diseases complicating pregnancy, first trimester: Secondary | ICD-10-CM | POA: Insufficient documentation

## 2024-03-05 DIAGNOSIS — Z9049 Acquired absence of other specified parts of digestive tract: Secondary | ICD-10-CM | POA: Diagnosis not present

## 2024-03-05 DIAGNOSIS — O26891 Other specified pregnancy related conditions, first trimester: Secondary | ICD-10-CM | POA: Diagnosis not present

## 2024-03-05 DIAGNOSIS — N8312 Corpus luteum cyst of left ovary: Secondary | ICD-10-CM | POA: Diagnosis not present

## 2024-03-05 DIAGNOSIS — R748 Abnormal levels of other serum enzymes: Secondary | ICD-10-CM | POA: Diagnosis not present

## 2024-03-05 DIAGNOSIS — E876 Hypokalemia: Secondary | ICD-10-CM | POA: Insufficient documentation

## 2024-03-05 DIAGNOSIS — Z3A08 8 weeks gestation of pregnancy: Secondary | ICD-10-CM | POA: Insufficient documentation

## 2024-03-05 DIAGNOSIS — R109 Unspecified abdominal pain: Secondary | ICD-10-CM

## 2024-03-05 DIAGNOSIS — O99611 Diseases of the digestive system complicating pregnancy, first trimester: Secondary | ICD-10-CM | POA: Diagnosis not present

## 2024-03-05 DIAGNOSIS — O3481 Maternal care for other abnormalities of pelvic organs, first trimester: Secondary | ICD-10-CM | POA: Insufficient documentation

## 2024-03-05 DIAGNOSIS — R1012 Left upper quadrant pain: Secondary | ICD-10-CM | POA: Diagnosis present

## 2024-03-05 LAB — URINALYSIS, ROUTINE W REFLEX MICROSCOPIC
Bilirubin Urine: NEGATIVE
Glucose, UA: NEGATIVE mg/dL
Hgb urine dipstick: NEGATIVE
Ketones, ur: 20 mg/dL — AB
Leukocytes,Ua: NEGATIVE
Nitrite: NEGATIVE
Protein, ur: NEGATIVE mg/dL
Specific Gravity, Urine: 1.02 (ref 1.005–1.030)
pH: 5 (ref 5.0–8.0)

## 2024-03-05 LAB — CBC WITH DIFFERENTIAL/PLATELET
Abs Immature Granulocytes: 0.02 10*3/uL (ref 0.00–0.07)
Basophils Absolute: 0 10*3/uL (ref 0.0–0.1)
Basophils Relative: 0 %
Eosinophils Absolute: 0 10*3/uL (ref 0.0–0.5)
Eosinophils Relative: 0 %
HCT: 30.6 % — ABNORMAL LOW (ref 36.0–46.0)
Hemoglobin: 10.2 g/dL — ABNORMAL LOW (ref 12.0–15.0)
Immature Granulocytes: 0 %
Lymphocytes Relative: 20 %
Lymphs Abs: 1.4 10*3/uL (ref 0.7–4.0)
MCH: 27.1 pg (ref 26.0–34.0)
MCHC: 33.3 g/dL (ref 30.0–36.0)
MCV: 81.4 fL (ref 80.0–100.0)
Monocytes Absolute: 0.6 10*3/uL (ref 0.1–1.0)
Monocytes Relative: 8 %
Neutro Abs: 5 10*3/uL (ref 1.7–7.7)
Neutrophils Relative %: 72 %
Platelets: 148 10*3/uL — ABNORMAL LOW (ref 150–400)
RBC: 3.76 MIL/uL — ABNORMAL LOW (ref 3.87–5.11)
RDW: 14.8 % (ref 11.5–15.5)
WBC: 7 10*3/uL (ref 4.0–10.5)
nRBC: 0 % (ref 0.0–0.2)

## 2024-03-05 LAB — COMPREHENSIVE METABOLIC PANEL WITH GFR
ALT: 15 U/L (ref 0–44)
AST: 20 U/L (ref 15–41)
Albumin: 3.8 g/dL (ref 3.5–5.0)
Alkaline Phosphatase: 40 U/L (ref 38–126)
Anion gap: 9 (ref 5–15)
BUN: 9 mg/dL (ref 6–20)
CO2: 21 mmol/L — ABNORMAL LOW (ref 22–32)
Calcium: 8.5 mg/dL — ABNORMAL LOW (ref 8.9–10.3)
Chloride: 105 mmol/L (ref 98–111)
Creatinine, Ser: 0.61 mg/dL (ref 0.44–1.00)
GFR, Estimated: >60 mL/min (ref >60)
Glucose, Bld: 103 mg/dL — ABNORMAL HIGH (ref 70–99)
Potassium: 2.8 mmol/L — ABNORMAL LOW (ref 3.5–5.1)
Sodium: 135 mmol/L (ref 135–145)
Total Bilirubin: 1.2 mg/dL (ref 0.0–1.2)
Total Protein: 6.8 g/dL (ref 6.5–8.1)

## 2024-03-05 LAB — MAGNESIUM: Magnesium: 1.8 mg/dL (ref 1.7–2.4)

## 2024-03-05 LAB — AMYLASE: Amylase: 102 U/L — ABNORMAL HIGH (ref 28–100)

## 2024-03-05 LAB — POCT PREGNANCY, URINE: Preg Test, Ur: POSITIVE — AB

## 2024-03-05 LAB — LIPASE, BLOOD: Lipase: 25 U/L (ref 11–51)

## 2024-03-05 MED ORDER — LACTATED RINGERS IV BOLUS: 1000.0000 mL | Freq: Once | INTRAVENOUS | Status: DC

## 2024-03-05 MED ORDER — LACTATED RINGERS IV BOLUS
1000.0000 mL | Freq: Once | INTRAVENOUS | Status: AC
  Administered 2024-03-05: 1000 mL via INTRAVENOUS

## 2024-03-05 MED ORDER — SCOPOLAMINE 1 MG/3DAYS TD PT72
1.0000 | MEDICATED_PATCH | Freq: Once | TRANSDERMAL | Status: DC
  Administered 2024-03-05: 1.5 mg via TRANSDERMAL
  Filled 2024-03-05: qty 1

## 2024-03-05 MED ORDER — METOCLOPRAMIDE HCL 10 MG PO TABS
10.0000 mg | ORAL_TABLET | Freq: Once | ORAL | Status: AC
  Administered 2024-03-05: 10 mg via ORAL
  Filled 2024-03-05: qty 1

## 2024-03-05 MED ORDER — POTASSIUM CHLORIDE 10 MEQ/100ML IV SOLN
10.0000 meq | INTRAVENOUS | Status: AC
  Administered 2024-03-05 (×3): 10 meq via INTRAVENOUS
  Filled 2024-03-05 (×3): qty 100

## 2024-03-05 MED ORDER — POLYETHYLENE GLYCOL 3350 17 GM/SCOOP PO POWD: 17.0000 g | Freq: Every day | ORAL | 1 refills | Status: AC | PRN

## 2024-03-05 MED ORDER — OXYCODONE HCL 5 MG PO TABS
5.0000 mg | ORAL_TABLET | ORAL | Status: AC
  Administered 2024-03-05: 5 mg via ORAL
  Filled 2024-03-05: qty 1

## 2024-03-05 MED ORDER — LIDOCAINE VISCOUS HCL 2 % MT SOLN
15.0000 mL | Freq: Once | OROMUCOSAL | Status: DC
  Filled 2024-03-05: qty 15

## 2024-03-05 MED ORDER — POTASSIUM CHLORIDE CRYS ER 20 MEQ PO TBCR
40.0000 meq | EXTENDED_RELEASE_TABLET | Freq: Once | ORAL | Status: DC
  Filled 2024-03-05: qty 2

## 2024-03-05 MED ORDER — MORPHINE SULFATE (PF) 4 MG/ML IV SOLN
2.0000 mg | Freq: Once | INTRAVENOUS | Status: AC
  Administered 2024-03-05: 2 mg via INTRAVENOUS
  Filled 2024-03-05: qty 1

## 2024-03-05 MED ORDER — ALUM & MAG HYDROXIDE-SIMETH 200-200-20 MG/5ML PO SUSP
30.0000 mL | Freq: Once | ORAL | Status: DC
  Filled 2024-03-05: qty 30

## 2024-03-05 MED ORDER — CYCLOBENZAPRINE HCL 5 MG PO TABS
10.0000 mg | ORAL_TABLET | Freq: Once | ORAL | Status: AC
  Administered 2024-03-05: 10 mg via ORAL
  Filled 2024-03-05: qty 2

## 2024-03-05 MED ORDER — PROMETHAZINE HCL 12.5 MG PO TABS: 12.5000 mg | ORAL_TABLET | Freq: Four times a day (QID) | ORAL | 0 refills | Status: DC | PRN

## 2024-03-05 NOTE — MAU Note (Signed)
.  Veronica Pittman is a 23 y.o. at Unknown here in MAU reporting: abd pain that started 3 days ago. Denies any vag bleeding or discharge.   LMP: 01/09/2024 Onset of complaint: 3 days Pain score: 10 Vitals:   03/05/24 1014  BP: 115/66  Pulse: 71  Resp: 18  Temp: 98.2 F (36.8 C)     FHT: n/a  Lab orders placed from triage: UPT, U/A

## 2024-03-05 NOTE — MAU Provider Note (Signed)
 Chief Complaint:  Abdominal Pain   HPI   None     Veronica Pittman is a 23 y.o. G3P1001 at [redacted]w[redacted]d who presents to maternity admissions reporting stomach pain.   She reports that she has been having upper left abdominal pain for the last three days. It has progressively gotten worse. She has been seen at [facility] ER the past two days for the same complaint. She was confirmed to have an IUP on U/S, labs notable for mild hypokalemia, but otherwise unrevealing, and noted to have clue cells on wet prep. Yesterday, a renal U/S was performed and was normal. She received IV fluids, Morphine, and antiemetics during both admissions. She reports ultimately, she was told that all her workup was unrevealing. Nothing has been helping her pain at home. She has tried tylenol and NSAIDS with the last dose being at 7 am. She denies any other pain. She denies any urinary symptoms, no vaginal bleeding. She reports last bowel movement was 2 days ago.  Pregnancy Course: [redacted] weeks pregnant  Past Medical History:  Diagnosis Date   Medical history non-contributory    OB History  Gravida Para Term Preterm AB Living  3 1 1   1   SAB IAB Ectopic Multiple Live Births      1    # Outcome Date GA Lbr Len/2nd Weight Sex Type Anes PTL Lv  3 Current           2 Term 07/07/16    M Vag-Spont   LIV  1 Gravida            Past Surgical History:  Procedure Laterality Date   CHOLECYSTECTOMY  2025   NO PAST SURGERIES     Family History  Problem Relation Age of Onset   Healthy Mother    Healthy Father    Social History   Tobacco Use   Smoking status: Never   Smokeless tobacco: Never  Vaping Use   Vaping status: Never Used  Substance Use Topics   Alcohol use: No   Drug use: Not Currently   No Known Allergies No medications prior to admission.    I have reviewed patient's Past Medical Hx, Surgical Hx, Family Hx, Social Hx, medications and allergies.   ROS  Pertinent items noted in HPI and remainder of  comprehensive ROS otherwise negative.   PHYSICAL EXAM  Patient Vitals for the past 24 hrs:  BP Temp Temp src Pulse Resp SpO2 Height  03/05/24 1837 132/73 99.1 F (37.3 C) Oral 68 19 100 % --  03/05/24 1538 133/76 -- -- 61 18 -- --  03/05/24 1106 128/86 -- -- 61 -- 98 % --  03/05/24 1014 115/66 98.2 F (36.8 C) -- 71 18 -- 5\' 2"  (1.575 m)    Constitutional: Well-developed, well-nourished female, uncomfortable and writhing at bedside Cardiovascular: normal rate & rhythm, warm and well-perfused Respiratory: normal effort, no problems with respiration noted GI: Abd soft, mild tender in the upper left quadrant, no tenderness in any other quadrants with no guarding or rebound throughout abdomen, non-distended MS: Extremities nontender, no edema, normal ROM Neurologic: Alert and oriented x 4.  GU: no CVA tenderness bilaterally  Labs: Results for orders placed or performed during the hospital encounter of 03/05/24 (from the past 24 hours)  Urinalysis, Routine w reflex microscopic -Urine, Clean Catch     Status: Abnormal   Collection Time: 03/05/24 10:17 AM  Result Value Ref Range   Color, Urine YELLOW YELLOW   APPearance CLOUDY (  A) CLEAR   Specific Gravity, Urine 1.020 1.005 - 1.030   pH 5.0 5.0 - 8.0   Glucose, UA NEGATIVE NEGATIVE mg/dL   Hgb urine dipstick NEGATIVE NEGATIVE   Bilirubin Urine NEGATIVE NEGATIVE   Ketones, ur 20 (A) NEGATIVE mg/dL   Protein, ur NEGATIVE NEGATIVE mg/dL   Nitrite NEGATIVE NEGATIVE   Leukocytes,Ua NEGATIVE NEGATIVE  Pregnancy, urine POC     Status: Abnormal   Collection Time: 03/05/24 10:18 AM  Result Value Ref Range   Preg Test, Ur POSITIVE (A) NEGATIVE  CBC with Differential/Platelet     Status: Abnormal   Collection Time: 03/05/24 10:57 AM  Result Value Ref Range   WBC 7.0 4.0 - 10.5 K/uL   RBC 3.76 (L) 3.87 - 5.11 MIL/uL   Hemoglobin 10.2 (L) 12.0 - 15.0 g/dL   HCT 78.2 (L) 95.6 - 21.3 %   MCV 81.4 80.0 - 100.0 fL   MCH 27.1 26.0 - 34.0 pg    MCHC 33.3 30.0 - 36.0 g/dL   RDW 08.6 57.8 - 46.9 %   Platelets 148 (L) 150 - 400 K/uL   nRBC 0.0 0.0 - 0.2 %   Neutrophils Relative % 72 %   Neutro Abs 5.0 1.7 - 7.7 K/uL   Lymphocytes Relative 20 %   Lymphs Abs 1.4 0.7 - 4.0 K/uL   Monocytes Relative 8 %   Monocytes Absolute 0.6 0.1 - 1.0 K/uL   Eosinophils Relative 0 %   Eosinophils Absolute 0.0 0.0 - 0.5 K/uL   Basophils Relative 0 %   Basophils Absolute 0.0 0.0 - 0.1 K/uL   Immature Granulocytes 0 %   Abs Immature Granulocytes 0.02 0.00 - 0.07 K/uL  Comprehensive metabolic panel     Status: Abnormal   Collection Time: 03/05/24 10:57 AM  Result Value Ref Range   Sodium 135 135 - 145 mmol/L   Potassium 2.8 (L) 3.5 - 5.1 mmol/L   Chloride 105 98 - 111 mmol/L   CO2 21 (L) 22 - 32 mmol/L   Glucose, Bld 103 (H) 70 - 99 mg/dL   BUN 9 6 - 20 mg/dL   Creatinine, Ser 6.29 0.44 - 1.00 mg/dL   Calcium 8.5 (L) 8.9 - 10.3 mg/dL   Total Protein 6.8 6.5 - 8.1 g/dL   Albumin 3.8 3.5 - 5.0 g/dL   AST 20 15 - 41 U/L   ALT 15 0 - 44 U/L   Alkaline Phosphatase 40 38 - 126 U/L   Total Bilirubin 1.2 0.0 - 1.2 mg/dL   GFR, Estimated >52 >84 mL/min   Anion gap 9 5 - 15  Lipase, blood     Status: None   Collection Time: 03/05/24 10:57 AM  Result Value Ref Range   Lipase 25 11 - 51 U/L  Amylase     Status: Abnormal   Collection Time: 03/05/24 10:57 AM  Result Value Ref Range   Amylase 102 (H) 28 - 100 U/L  Magnesium     Status: None   Collection Time: 03/05/24 10:57 AM  Result Value Ref Range   Magnesium 1.8 1.7 - 2.4 mg/dL    Imaging:  MR ABDOMEN WO CONTRAST Result Date: 03/05/2024 CLINICAL DATA:  Abdominal pain.  Pregnant patient. EXAM: MRI ABDOMEN AND PELVIS WITHOUT CONTRAST TECHNIQUE: Multiplanar multisequence MR imaging of the abdomen and pelvis was performed. No intravenous contrast was administered. COMPARISON:  None Available. FINDINGS: COMBINED FINDINGS FOR BOTH MR ABDOMEN AND PELVIS Lower chest: Lung bases are clear.  Hepatobiliary: No focal hepatic lesion. Postcholecystectomy. No biliary dilatation. Pancreas: Pancreas is normal. No ductal dilatation. No pancreatic inflammation. Spleen: Normal spleen Adrenals/urinary tract: Adrenal glands and kidneys are normal. The ureters and bladder normal. No hydronephrosis Stomach/Bowel: Stomach, duodenum small-bowel normal. Appendix is not clearly identified but no secondary signs of appendicitis. Vascular/Lymphatic: Abdominal aorta is normal caliber. No periportal or retroperitoneal adenopathy. No pelvic adenopathy. Reproductive: Gravid uterus. Normal RIGHT ovary adjacent to the cecum measures 3.8 by 1.7 cm on image 37/9 with small follicles. Normal LEFT ovary with small follicles and corpus luteal cyst measures 5.3 x 2.8 cm on image 35/series 9. Other: Trace free fluid the in posterior cul-de-sac. Musculoskeletal: No aggressive osseous lesion. IMPRESSION: 1. No evidence of infection or inflammation. 2. Postcholecystectomy. 3. No hydronephrosis. 4. Normal ovaries and gravid uterus. 5. Appendix not identified but no secondary signs appendicitis. Electronically Signed   By: Genevive Bi M.D.   On: 03/05/2024 18:19   MR PELVIS WO CONTRAST Result Date: 03/05/2024 CLINICAL DATA:  Abdominal pain.  Pregnant patient. EXAM: MRI ABDOMEN AND PELVIS WITHOUT CONTRAST TECHNIQUE: Multiplanar multisequence MR imaging of the abdomen and pelvis was performed. No intravenous contrast was administered. COMPARISON:  None Available. FINDINGS: COMBINED FINDINGS FOR BOTH MR ABDOMEN AND PELVIS Lower chest: Lung bases are clear. Hepatobiliary: No focal hepatic lesion. Postcholecystectomy. No biliary dilatation. Pancreas: Pancreas is normal. No ductal dilatation. No pancreatic inflammation. Spleen: Normal spleen Adrenals/urinary tract: Adrenal glands and kidneys are normal. The ureters and bladder normal. No hydronephrosis Stomach/Bowel: Stomach, duodenum small-bowel normal. Appendix is not clearly identified  but no secondary signs of appendicitis. Vascular/Lymphatic: Abdominal aorta is normal caliber. No periportal or retroperitoneal adenopathy. No pelvic adenopathy. Reproductive: Gravid uterus. Normal RIGHT ovary adjacent to the cecum measures 3.8 by 1.7 cm on image 37/9 with small follicles. Normal LEFT ovary with small follicles and corpus luteal cyst measures 5.3 x 2.8 cm on image 35/series 9. Other: Trace free fluid the in posterior cul-de-sac. Musculoskeletal: No aggressive osseous lesion. IMPRESSION: 1. No evidence of infection or inflammation. 2. Postcholecystectomy. 3. No hydronephrosis. 4. Normal ovaries and gravid uterus. 5. Appendix not identified but no secondary signs appendicitis. Electronically Signed   By: Genevive Bi M.D.   On: 03/05/2024 18:19    MDM & MAU COURSE  MDM: 23 y.o. G3P1001 at [redacted]w[redacted]d who presents w complaint of abdominal pain, seen in ER x 2 days for the same prior to presentation today. She is visibly uncomfortable, no focal TTP and overall benign abdomen. She is hemodynamically stable. Labs obtained -- no leukocytosis. Hypokalemia to 2.8 noted, which was repleted. Amylase barely over ULN. She was given IV Morphine 2mg  w relief of symptoms.   - Her amylase was only slightly elevated MRI ordered to r/o atypical presentation of appendicitis  - She was given a scopolamine patch to help with nausea and vomiting  - Potassium was found to be low and repletion given - Pt initially given 2mg  of IV Morphine with mild relief of pain, however pain returned and pt requested repeat dose of pain meds -- given Roxicodone and Flexeril, with which she had reported no relief of symptoms  MAU Course: Orders Placed This Encounter  Procedures   MR ABDOMEN WO CONTRAST   MR PELVIS WO CONTRAST   Urinalysis, Routine w reflex microscopic -Urine, Clean Catch   CBC with Differential/Platelet   Comprehensive metabolic panel   Lipase, blood   Amylase   Magnesium   Pregnancy, urine POC    Discharge  patient Discharge disposition: 01-Home or Self Care; Discharge patient date: 03/05/2024   Meds ordered this encounter  Medications   metoCLOPramide (REGLAN) tablet 10 mg   AND Linked Order Group    alum & mag hydroxide-simeth (MAALOX/MYLANTA) 200-200-20 MG/5ML suspension 30 mL    lidocaine (XYLOCAINE) 2 % viscous mouth solution 15 mL   lactated ringers bolus 1,000 mL   morphine (PF) 4 MG/ML injection 2 mg   oxyCODONE (Oxy IR/ROXICODONE) immediate release tablet 5 mg    Refill:  0   cyclobenzaprine (FLEXERIL) tablet 10 mg   scopolamine (TRANSDERM-SCOP) 1 MG/3DAYS 1.5 mg   potassium chloride 10 mEq in 100 mL IVPB   potassium chloride SA (KLOR-CON M) CR tablet 40 mEq   DISCONTD: lactated ringers bolus 1,000 mL   lactated ringers bolus 1,000 mL   polyethylene glycol powder (GLYCOLAX/MIRALAX) 17 GM/SCOOP powder    Sig: Take 17 g by mouth daily as needed.    Dispense:  510 g    Refill:  1   promethazine (PHENERGAN) 12.5 MG tablet    Sig: Take 1 tablet (12.5 mg total) by mouth every 6 (six) hours as needed for nausea or vomiting.    Dispense:  30 tablet    Refill:  0   1627  Pt back from MRI, requesting additional doses of Morphine -- awaiting results of MRI. Remainder of results as above. On initial arrival to reassess pt, she is laying supine in bed, however as I began discussing my concerns, pt began having writhing movements and reported discomfort. Discussed my desire to avoid narcotics at this time given potential harm to patient and to pregnancy. Offered Tylenol but pt declines. She declined PO potassium to help replete her K. Will await MRI read -- radiology called and I asked they expedite the read.  1847 MRI read back -- corpus luteum of left ovary. Suspect this as well as constipation driving pt's symptoms. Reviewed results w pt. Will send Rx for Phenergan and Miralax for pt. Stable for d/c.  ASSESSMENT   1. Corpus luteum cyst of left ovary   2. Left sided abdominal  pain   3. Constipation, unspecified constipation type     PLAN  Discharge home in stable condition with return precautions.     Follow-up Information     [facility] Follow up.   Specialty: Obstetrics and Gynecology Contact information: [address] [place]  [phone]                Allergies as of 03/05/2024   No Known Allergies      Medication List     STOP taking these medications    doxylamine (Sleep) 25 MG tablet Commonly known as: UNISOM   famotidine 20 MG tablet Commonly known as: Pepcid   pyridOXINE 25 MG tablet Commonly known as: VITAMIN B6       TAKE these medications    polyethylene glycol powder 17 GM/SCOOP powder Commonly known as: GLYCOLAX/MIRALAX Take 17 g by mouth daily as needed.   promethazine 12.5 MG tablet Commonly known as: PHENERGAN Take 1 tablet (12.5 mg total) by mouth every 6 (six) hours as needed for nausea or vomiting. What changed:  medication strength how much to take        Angelique Barer, PA-S  Attestation of Supervision of Student:  I confirm that I have verified the information documented in the physician assistant student's note and that I have also personally reperformed the  history, physical exam and all medical decision making activities.  I have verified that all services and findings are accurately documented in this student's note; and I agree with management and plan as outlined in the documentation. I have also made any necessary editorial changes.  Melanie Spires, MD [facility], [facility] 03/05/2024 8:31 PM

## 2024-03-10 NOTE — ED Provider Notes (Signed)
 [facility]  ED Provider Note  Veronica Pittman 23 y.o. female DOB: 2001-07-10 MRN: [id number] History   Chief Complaint  Patient presents with  . Abdominal Pain (Pregnant)    LLQ x 2 days, N&V   Patient is a 23 year old female with no major past medical history who presents the emergency department today for evaluation of persistent nausea and vomiting early pregnancy.  This is her fourth ER visit this week.  She has had an OB ultrasound that showed an IUP at 6 weeks and 6 days.  She had a renal ultrasound that was normal and an MRI of her abdomen pelvis that was also generally unremarkable.  She is status post cholecystectomy.  She states that she is continue to have nausea and vomiting greater than 10 times today and cannot tolerate any p.o.  She states that she quit smoking marijuana when she started vomiting.  Denies any blood in her vomit or her stool.  No diarrhea or constipation.  She reports 9 out of 10 lower abdominal pain that has been persistent, constant, nonradiating.  No vaginal bleeding or discharge.  No urinary symptoms.  Noted to have at least 150 ketones in her urine at both visits here recently.  She was discharged with Zofran  and Reglan  this week       History reviewed. No pertinent past medical history.  History reviewed. No pertinent surgical history.  Social History   Substance and Sexual Activity  Alcohol Use Never   Social History   Tobacco Use  Smoking Status Never  Smokeless Tobacco Never   E-Cigarettes  . Vaping Use Never User   . Start Date    . Cartridges/Day    . Quit Date     Social History   Substance and Sexual Activity  Drug Use Never         No Known Allergies  Home Medications   METOCLOPRAMIDE  HCL (REGLAN ) 10 MG TABLET    Take one tablet (10 mg dose) by mouth every 6 (six) hours for 10 days.   METRONIDAZOLE (FLAGYL) 500 MG TABLET    Take one tablet (500 mg dose) by mouth 3 (three) times a day for 20  doses.   ONDANSETRON  (ZOFRAN -ODT) 4 MG DISINTEGRATING TABLET    Take one tablet (4 mg dose) by mouth every 6 (six) hours as needed.   POTASSIUM CHLORIDE  10 MEQ CR TABLET    Take one tablet (10 mEq dose) by mouth 2 (two) times daily for 4 days.   PROMETHAZINE  (PHENERGAN ) 25 MG TABLET    Take one tablet (25 mg dose) by mouth every 6 (six) hours as needed.   PROMETHAZINE  (PHENERGAN ,PHENADOZ) 25 MG SUPPOSITORY    Place one suppository (25 mg dose) rectally every 6 (six) hours as needed for Nausea.    Primary Survey  Primary Survey  Review of Systems   Review of Systems  Gastrointestinal:  Positive for abdominal pain, nausea and vomiting.  All other systems reviewed and are negative.   Physical Exam   ED Triage Vitals [03/10/24 0123]  BP (!) 142/93  Heart Rate 78  Resp 15  SpO2 98 %  Temp 98.5 F (36.9 C)    Physical Exam  Constitutional: She appears well-developed and well-nourished. She does not appear distressed and no respiratory distress.  Sleeping in no acute distress  HENT:  Head: Normocephalic and atraumatic.  Right Ear: Normal external ear.  Left Ear: Normal external ear.  Nose: Nose normal.  Mouth/Throat: No  oropharyngeal exudate. Eyes: EOM are intact. Conjunctivae are normal. Pupils are equal, round, and reactive to light. Right eye: no drainage. Left eye: no drainage. No scleral icterus.  Neck: No JVD. No tracheal deviation.  Cardiovascular: Normal rate, regular rhythm and intact distal pulses.  No audible murmur. No friction rub and gallop.  Pulmonary/Chest: No respiratory distress. Respiratory effort normal and breath sounds normal.  Abdominal: Soft. There is no abdominal tenderness. Abdomen not distended. Bowel sounds are normal.  No CVA tenderness  Musculoskeletal: No obvious deformity noted to extremities.     Cervical back: No rigidity. no edema.  Neurological: She is alert and oriented to person, place, and time. Cranial nerves intact II through XII. She  has normal strength. Sensory intact.  Skin: Skin is warm. Skin is dry. No erythema noted.  Psychiatric: She has a normal mood and affect.     ED Course   Lab results:   CBC AND DIFFERENTIAL - Abnormal      Result Value   WBC 5.9     RBC 4.53     HGB 12.1     HCT 35.6     MCV 78.6 (*)    MCH 26.7     MCHC 34.0     Plt Ct 227     RDW SD 40.0     MPV 10.6     NRBC% 0.0     Absolute NRBC Count 0.00     NEUTROPHIL % 58.8     LYMPHOCYTE % 29.2     MONOCYTE % 11.2     Eosinophil % 0.3     BASOPHIL % 0.3     IG% 0.2     ABSOLUTE NEUTROPHIL COUNT 3.46     ABSOLUTE LYMPHOCYTE COUNT 1.72     Absolute Monocyte Count 0.66     Absolute Eosinophil Count 0.02     Absolute Basophil Count 0.02     Absolute Immature Granulocyte Count 0.01    COMPREHENSIVE METABOLIC PANEL - Abnormal   Na 136     Potassium 3.1 (*)    Cl 96 (*)    CO2 27     AGAP 13     Glucose 90     BUN 8     Creatinine 0.57     Ca 8.5 (*)    ALK PHOS 58     T Bili 1.5 (*)    Total Protein 7.1     Alb 4.6     GLOBULIN 2.5     ALBUMIN/GLOBULIN RATIO 1.8     BUN/CREAT RATIO 14.0     ALT 22     AST 13     eGFR 130     Comment: Normal GFR (glomerular filtration rate) > 60 mL/min/1.73 meters squared, < 60 may include impaired kidney function. Calculation based on the Chronic Kidney Disease Epidemiology Collaboration (CK-EPI)equation refit without adjustment for race.  URINALYSIS ONLY - NO SYMPTOMS - Abnormal   Urine Color Yellow     Urine Clarity Cloudy (*)    Urine Specific Gravity 1.017     Urine pH 6.0     Urine Protein - Dipstick 10 (*)    Urine Glucose 500 (*)    Urine Ketones 100 (*)    Urine Bilirubin Negative     Urine Blood Negative     Urine Nitrite Negative     Urine Urobilinogen 4 (*)    Urine Leukocyte Esterase 250 (*)    Urine Squamous Epithelial Cells  10-20 (*)    Urine WBC 3-5 (*)    Urine RBC 0     Urine Bacteria 1+ (*)    Urine Mucous 1+ (*)    UA Microscopic Yes Micro (*)      Imaging: No data to display    ECG: ECG Results   None                                                               Pre-Sedation Procedures    Medical Decision Making 23 year old female who presents to the emergency department today for evaluation of persistent nausea and vomiting in early pregnancy.  Her medical screening exam is generally unremarkable at this time.  Differential diagnosis: Hyperemesis gravidarum versus cannabinoid hyperemesis versus nausea and vomiting early pregnancy.  Her prior evaluations have revealed significant ketonuria.  We will check basic labs, UA, give her dose IV Reglan , normal saline and D5 normal saline and reevaluate.  No imaging indicated.  Labs generally unremarkable.  Ketones are 100 today.  She is feeling much better and tolerating p.o.  Stable for discharge to continue Reglan  and Zofran  and I instructed her about B6 and Unisom  over-the-counter.  Amount and/or Complexity of Data Reviewed Labs: ordered.  Risk Prescription drug management.           Provider Communication  New Prescriptions   No medications on file    Modified Medications   No medications on file    Discontinued Medications   No medications on file    Clinical Impression Final diagnoses:  Nausea and vomiting during pregnancy (*)  Dehydration    ED Disposition     ED Disposition  Discharge   Condition  Stable   Comment  --                 Follow-up Information     [facility] Schedule an appointment as soon as possible for a visit in 2 days.   Comments: For follow up Contact information: [address] [place]  [id number] [phone]                 Electronically signed by:    Norleen JONETTA Hopes, MD 03/10/24 [phone]

## 2024-05-28 NOTE — ED Provider Notes (Signed)
 [facility]  ED Provider Note  Veronica Pittman 23 y.o. female DOB: 2001-08-27 MRN: [id number] History   Chief Complaint  Patient presents with  . Abdominal Pain    X 2 days ago; reports n/v/d; unsure if she is pregnant, last period 2 months ago   Patient is a 23 year old female with no major past medical history other than chronic marijuana abuse who presents to the emergency department today for evaluation of nausea and vomiting.  Patient reports having nausea and vomiting today, constant, would not tell me how many times he has vomited he states too many.  She denies any blood in her vomit.  She denies any diarrhea.  She reports diffuse abdominal pain she reports that her last menstrual cycle was in May.  She had been seen here multiple times back in April when she was pregnancy for nausea and vomiting with negative MRI of the abdomen and pelvis, negative for renal ultrasound and had transvaginal ultrasound that dated her at 6 weeks and 6 days.  However, she states that the next week she miscarried.  She states she had a normal menstrual cycle sometime in mid May and has not had 1 since then.  No fever or chills.  She denies any upper respiratory symptoms, chest pain, shortness of breath or other complaints.       History reviewed. No pertinent past medical history.  History reviewed. No pertinent surgical history.  Social History   Substance and Sexual Activity  Alcohol Use Never   Tobacco Use History[1] E-Cigarettes  . Vaping Use Never User   . Start Date    . Cartridges/Day    . Quit Date     Social History   Substance and Sexual Activity  Drug Use Yes  . Types: Marijuana   Tetanus up to date?: Unknown Immunizations Up to Date?: Unknown   Allergies[2]  Home Medications   No medications on file    Primary Survey  Primary Survey  Review of Systems   Review of Systems  Gastrointestinal:  Positive for abdominal pain, nausea and  vomiting.  All other systems reviewed and are negative.   Physical Exam   ED Triage Vitals [05/28/24 1726]  BP   Heart Rate 72  Resp 18  SpO2 100 %  Temp 98.2 F (36.8 C)    Physical Exam  Constitutional: She appears well-developed and well-nourished. She does not appear distressed and no respiratory distress.  HENT:  Head: Normocephalic and atraumatic.  Right Ear: Normal external ear.  Left Ear: Normal external ear.  Nose: Nose normal.  Mouth/Throat: No oropharyngeal exudate. Eyes: EOM are intact. Conjunctivae are normal. Pupils are equal, round, and reactive to light. Right eye: no drainage. Left eye: no drainage. No scleral icterus.  Neck: No JVD. No tracheal deviation.  Cardiovascular: Normal rate, regular rhythm and intact distal pulses.  No audible murmur. No friction rub and gallop.  Pulmonary/Chest: No respiratory distress. Respiratory effort normal and breath sounds normal.  Abdominal: Soft. There is no abdominal tenderness. Abdomen not distended. Bowel sounds are normal.  No CVA tenderness  Musculoskeletal: No obvious deformity noted to extremities.     Cervical back: No rigidity. no edema.  Neurological: She is alert and oriented to person, place, and time. Cranial nerves intact II through XII. She has normal strength. Sensory intact.  Skin: Skin is warm. Skin is dry. No erythema noted.  Psychiatric: She has a normal mood and affect.     ED Course  Lab results: No data to display  Imaging: No data to display    ECG: ECG Results   None                                                                     Pre-Sedation Procedures    Medical Decision Making 23 year old female who presents to the emergency department today for evaluation of nausea and vomiting, abdominal pain.  On exam, patient is afebrile, no acute distress, regular rhythm, lungs are clear, abdomen is benign with no CVA tenderness.  Differential  diagnosis: I suspect that her symptoms are secondary to cannabinol hyperemesis syndrome, rule out pregnancy, she has a benign abdomen so I do not suspect appendicitis, obstruction, pancreatitis etc.  She has had several CT scans in the past that were negative and MRI that was -3 months ago.  We will check basic labs, pregnancy test, give her a dose of IV morphine , Reglan , fluids and reevaluate.  IV was just established right before physician shift change.  She has been given her medicines now.  I will prepare her discharge papers as if everything comes back unremarkable as expected and will have night shift follow-up on her labs and reassess.  Amount and/or Complexity of Data Reviewed Labs: ordered.  Risk Prescription drug management.           Provider Communication  New Prescriptions   FAMOTIDINE  (PEPCID ) 20 MG TABLET    Take one tablet (20 mg dose) by mouth 2 (two) times daily.      Quantity: 30 tablet    Refills: 0   PROCHLORPERAZINE  (COMPAZINE ) 10 MG TABLET    Take one tablet (10 mg dose) by mouth every 8 (eight) hours as needed.      Quantity: 15 tablet    Refills: 0    Modified Medications   No medications on file    Discontinued Medications   ONDANSETRON  (ZOFRAN -ODT) 4 MG DISINTEGRATING TABLET    Take one tablet (4 mg dose) by mouth every 6 (six) hours as needed.   POTASSIUM CHLORIDE  10 MEQ CR TABLET    Take one tablet (10 mEq dose) by mouth 2 (two) times daily for 4 days.   PROMETHAZINE  (PHENERGAN ) 25 MG TABLET    Take one tablet (25 mg dose) by mouth every 6 (six) hours as needed.   PROMETHAZINE  (PHENERGAN ,PHENADOZ) 25 MG SUPPOSITORY    Place one suppository (25 mg dose) rectally every 6 (six) hours as needed for Nausea.    Clinical Impression Final diagnoses:  Nausea and vomiting, unspecified vomiting type  Abdominal pain, unspecified abdominal location  Cannabinoid hyperemesis syndrome    ED Disposition     ED Disposition  Discharge   Condition  Stable    Comment  --                 Follow-up Information     [facility]. Schedule an appointment as soon as possible for a visit in 3 days.   Comments: For follow up Contact information: [address], [address] [place]  [address] [phone]                 Electronically signed by:       [1]  Social History Tobacco Use  Smoking Status Never  Smokeless Tobacco Never  [2] No Known Allergies  Norleen JONETTA Hopes, MD 05/28/24 [phone]

## 2024-05-28 NOTE — ED Notes (Signed)
 UTZ ongoing for this pt.

## 2024-05-28 NOTE — ED Notes (Signed)
 UTZ tech replied and is coming for this pt.

## 2024-05-28 NOTE — ED Notes (Addendum)
 Pt was marked ready for UTZ. Tried to call UTZ tech for this pt but no response, messaged was sent for the UTZ tech. House Supervisor was informed. Attending MD was informed.

## 2024-05-29 NOTE — ED Provider Notes (Signed)
 [facility]  ED Progress Note I resumed care of this patient at shift change.  Patient did start to have increased pain with worsening nausea and vomiting.  Patient was treated with IV morphine  and Zofran .  Patient's qualitative hCG came back positive.  Patient states that her miscarriage was the end of April and her last period was 1 month ago.  Quantitative beta-hCG was 80,000.  Patient did have OB ultrasound which showed 6-week 6-day IUP with small subchorionic hemorrhage.  Fetal heart tones were 123.  Patient did have bacteriuria.  Patient treated with Keflex and will be discharged on the same as well as Reglan  and Pepcid .  Patient states she was will follow-up with her OB/GYN in [place].  Patient was strongly advised to stop smoking marijuana.  She was instructed to return if any worsening symptoms.    Electronically Signed by:   Glendia CHRISTELLA Jude, MD 05/29/24 [phone]

## 2024-05-30 NOTE — ED Provider Notes (Signed)
 ------------------------------------------------------------------------------- Attestation signed by Katheryn Rollo Jewels, DO at 05/30/24 1756 Patient was seen by PA or NP and I was not involved in patient's work-up, care plan, treatment or disposition.  I was available for consultation at all points during patient's visit.   -------------------------------------------------------------------------------   [facility]  ED Provider Note  Veronica Pittman 23 y.o. female DOB: Sep 02, 2001 MRN: [id number] History   Chief Complaint  Patient presents with  . Abdominal Pain    Pt with left sided abdominal pain. States she was seen here the other day for the same. States she was never given anything for pain and the pain has gotten worse. Patient is pregnant.    Ms. Veronica Pittman is a [id number] 23 year old female with a PMH of chronic mariajuana use and cholecystectomy who presents to [facility] ED for evaluation of abdominal pain. Patient was evaluated at [facility] ED for similar complaints 2 days ago and was found to be pregnant. She said she did not have much improvement in her symptoms since leaving the ED. Over the past 2 days, she has continued to have abdominal pain, nausea and vomiting. He abdominal pain is localized to her right lower quadrant. She took a hot bath last night which provided minimal relief. When asked how many times she had emesis, she said too many and all night. She denies hematemesis, diarrhea, fever, or chills. The last time she smoked mariajuana was 3 days ago.  She has not followed up with OB yet.   She had been seen here multiple times back in April when she was pregnant for nausea and vomiting with negative MRI of the abdomen and pelvis, negative for renal ultrasound and had transvaginal ultrasound that dated her at 6 weeks and 6 days.  However, she states that the next week she miscarried.  She states she had a normal menstrual cycle sometime in mid May  and has not had 1 since then.          No past medical history on file.  No past surgical history on file.  Social History   Substance and Sexual Activity  Alcohol Use Never   Tobacco Use History[1] E-Cigarettes  . Vaping Use Never User   . Start Date    . Cartridges/Day    . Quit Date     Social History   Substance and Sexual Activity  Drug Use Yes  . Types: Marijuana   Tetanus up to date?: Yes Immunizations Up to Date?: Yes   Allergies[2]  Discharge Medication List as of 05/30/2024 11:43 AM     CONTINUE these medications which have NOT CHANGED   Details  cephALEXin (KEFLEX) 500 mg capsule Take one capsule (500 mg dose) by mouth 3 (three) times a day for 5 days., Starting Tue 05/29/2024, Until Sun 06/03/2024, Normal    famotidine  (PEPCID ) 20 mg tablet Take one tablet (20 mg dose) by mouth 2 (two) times daily., Starting Mon 05/28/2024, Normal    metoclopramide  HCl (REGLAN ) 10 mg tablet Take one tablet (10 mg dose) by mouth every 6 (six) hours as needed for up to 30 doses., Starting Tue 05/29/2024, Normal        Primary Survey   Exposure    No visible abdominal trauma.       Review of Systems   Review of Systems  Constitutional:  Negative for chills and fever.  HENT:  Negative for ear pain and sore throat.   Eyes:  Negative for pain and visual disturbance.  Respiratory:  Negative for cough and shortness of breath.   Cardiovascular:  Negative for chest pain and palpitations.  Gastrointestinal:  Positive for abdominal pain, nausea and vomiting. Negative for diarrhea.  Genitourinary:  Negative for dysuria and hematuria.  Musculoskeletal:  Negative for arthralgias and back pain.  Skin:  Negative for color change and rash.  Neurological:  Negative for seizures and syncope.  All other systems reviewed and are negative.   Physical Exam   ED Triage Vitals [05/30/24 0847]  BP 137/79  Heart Rate 54  Resp 16  SpO2 97 %  Temp 98.6 F (37 C)     Physical Exam  Nursing note and vitals reviewed. Constitutional: She appears well-developed and well-nourished. She appears to be in pain.  HENT:  Head: Normocephalic and atraumatic.  Eyes: Conjunctivae are normal.  Cardiovascular: Normal rate, regular rhythm and normal heart sounds.  Pulmonary/Chest: Respiratory effort normal and breath sounds normal.  Abdominal: Soft. There is abdominal tenderness in the right lower quadrant. There is no guarding. Abdomen not distended. No visible abdominal trauma.  Neurological: She is alert and oriented to person, place, and time.  Skin: Skin is warm. Skin is dry.  Psychiatric: She has a normal mood and affect. Her behavior is normal. Thought content normal.     ED Course   Lab results:   CBC AND DIFFERENTIAL - Abnormal      Result Value   WBC 8.0     RBC 4.38     HGB 12.1     HCT 35.2     MCV 80.4     MCH 27.6     MCHC 34.4     Plt Ct 190     RDW SD 45.8     MPV 11.7     NRBC% 0.0     Absolute NRBC Count 0.00     NEUTROPHIL % 64.9     LYMPHOCYTE % 22.8     MONOCYTE % 11.8     Eosinophil % 0.0     BASOPHIL % 0.1     IG% 0.4     ABSOLUTE NEUTROPHIL COUNT 5.17     ABSOLUTE LYMPHOCYTE COUNT 1.82     Absolute Monocyte Count 0.94 (*)    Absolute Eosinophil Count 0.00     Absolute Basophil Count 0.01     Absolute Immature Granulocyte Count 0.03    COMPREHENSIVE METABOLIC PANEL - Abnormal   Na 137     Potassium 3.3 (*)    Cl 99     CO2 24     AGAP 14     Glucose 94     BUN 15     Creatinine 0.72     Ca 9.7     ALK PHOS 58     T Bili 1.1     Total Protein 7.3     Alb 4.5     GLOBULIN 2.8     ALBUMIN/GLOBULIN RATIO 1.6     BUN/CREAT RATIO 20.8     ALT 15     AST 19     eGFR 119     Comment: Normal GFR (glomerular filtration rate) > 60 mL/min/1.73 meters squared, < 60 may include impaired kidney function. Calculation based on the Chronic Kidney Disease Epidemiology Collaboration (CK-EPI)equation refit without  adjustment for race.  URINALYSIS W/MICRO REFLEX CULTURE - SYMPTOMATIC - Abnormal   Urine Color Yellow     Urine Clarity Cloudy (*)    Urine Specific Gravity 1.035 (*)  Urine pH 6.5     Urine Protein - Dipstick 70 (*)    Urine Glucose Negative     Urine Ketones 60 (*)    Urine Bilirubin Negative     Urine Blood Negative     Urine Nitrite Negative     Urine Urobilinogen 4 (*)    Urine Leukocyte Esterase 75 (*)    Urine Squamous Epithelial Cells 50-75 (*)    Urine WBC 5-10 (*)    Urine RBC 0     Urine Bacteria 2+ (*)    UA Amorphous Few     UA Microscopic Yes Micro (*)    Narrative:    Does not meet criteria for reflex to Urine Culture.  URINE DRUGS OF ABUSE SCRN - Abnormal   Ur PH DOA Scr 6.5     Amphet Scr Negative     Barb Scr Negative     Benzo Scr Negative     Cannab Scr Positive (*)    Cocaine Scr Negative     Opiates Scr Positive (*)    Meth Scr Negative     Oxyco Scr Negative     Fentanyl  Scr Negative     Buprenorphine Screen Negative     Narrative:    Please Note Detection Levels Below:                            Amphetamines                    1000 ng/mL  Barbiturates                    200 ng/mL  Benzodiazepines                 200 ng/mL  Cannabinoids (Marijuana, THC)   50 ng/mL  Cocaine                         300 ng/mL  Opiates                         300 ng/mL  Methadone                       300 ng/mL  Oxycodone                        100 ng/mL  Fentanyl                           5 ng/mL  Buprenorphine                     5 ng/mL  This test is a screening test and results are only to be used for medical purposes.  If confirmation of positive results are needed, please order confirmation by GC/MS for each drug that needs confirmation.  Urine specimens are retained for 5 days.   HCG, QUANTITATIVE   B HCG Quant 13,471     Comment: Female -  0 - 3 mIU/mL  Female Non-pregnant -   0 - 5 mIU/mL  Female Postmenopausal  - 0 - 8 mIU/mL  Female  Pregnant by week of gestation: 3 weeks-   6 - 71 mIU/mL 4 weeks-   10 - 750 mIU/mL 5 weeks-  217 - 7138 mIU/mL 6  weeks-  158 - 31795 mIU/mL 7 weeks- 3697 - 836436 mIU/mL 8 weeks- 67934 - 149571 mIU/mL 9 weeks-  36196 - 151410 mIU/mL 10 weeks- 53490 - 813022 mIU/mL 12 weeks- 72167 - 210612 mIU/mL 14 weeks- 86049 - 62530 mIU/mL 15 weeks- 87960 - 70971 mIU/mL 16 weeks- 9040 - 56451 mIU/mL 17 weeks- 8175 - 55868 mIU/mL 18 weeks- 8099 - 58176 mIU/mL   LIGHT BLUE TOP  GOLD SST    Imaging:   US  OB < 14 WKS TRANSABDOMINAL ONLY   Narrative:    TECHNIQUE:  Present measuring 3.2 mm OB < 14 WKS TRANSABDOMINAL ONLY - real-time grayscale and M-mode Doppler imaging were used to examine the uterus read   COMPARISON:  05/28/2024  INDICATION: Abdominal Pain. Beta-hCG 13,479   Exam date/time:  05/30/2024 9:51 AM  FINDINGS:  #  Single intrauterine pregnancy is again noted with occasional gestational sac central upper uterine cavity suggesting reaction eccentric posteriorly. Fetal pole, with a crown-rump measurement 1 cm consistent with 7 weeks 2 days gestation (+/- 6 days.) Fetal heart activity is noted with the radial 149 beats. #  Similar hypoechoic margin around the lower aspect of gestational sac, unchanged. #  Right ovary measures 3.4 x 1.6 x 2.5 cm grossly unchanged from previous study. #  Left measures 5.3 x 3.5 x 3 cm and contains a low density area measuring 3 x 2 x 2.5 cm, which may be a collapsing corpus luteal cyst.    Impression:    IMPRESSION: 1.  Expected increase in size fetal pole, and increased beta-hCG, consistent with progressing intrauterine pregnancy. 2.  Stable hypoechoic margin around the lower end of the implantation. No large subchorionic hemorrhage.  Electronically Signed by: Franky Cheshire MD, PHD on 05/30/2024 11:31 AM      ECG: ECG Results   None                                                                      Pre-Sedation Procedures    Medical Decision Making Ms. Veronica Pittman is a [id number] 23 year old female with a PMH of chronic mariajuana use and cholecystectomy who presents to [facility] ED for evaluation of abdominal pain. Differentials considered include, ovarian cyst rupture, ovarian torsion,  hyperemesis gravidarum, cannabinoid hyperemesis syndrome. Abdomen did not appear acute on exam. US  was ordered to check on status of her current pregnancy and rule out ovarian cyst rupture or torsion.  US  from today revealed: - Expected increase in size fetal pole, and increased beta-hCG (82834->86,520, consistent with progressing intrauterine pregnancy. - Stable hypoechoic margin around the lower end of the implantation. No large subchorionic hemorrhage. - No interval changes in ovaries from last US .   UA was leukocyte esterase positive with bacteruria. Patient was given 4mg  of morphine  for pain control, 1,000 mL of NS for rehydration, and Zofran  4mg  for nausea with significant improvement in her abdominal pain and nausea. Plan is to discharge home with 5 days of Keflex for UTI and to follow-up with OB for routine prenatal care and further management of hyperemesis gravidarum. Instructed to continue Protonix and Reglan  at home. Follow up is unable to tolerate fluids.    Amount and/or Complexity of Data Reviewed Labs: ordered. Radiology: ordered.  Risk Prescription drug management.  Provider Communication  Discharge Medication List as of 05/30/2024 11:43 AM      Discharge Medication List as of 05/30/2024 11:43 AM      Discharge Medication List as of 05/30/2024 11:43 AM      Clinical Impression Final diagnoses:  Abdominal pain, unspecified abdominal location  Pregnancy, unspecified gestational age (*)    ED Disposition     ED Disposition  Discharge   Condition  Stable   Comment  --                 Follow-up Information     [facility].  Call in 1 day.   Contact information: [address] [place]  [address] [phone]                 Electronically signed by:       [1] Social History Tobacco Use  Smoking Status Never  Smokeless Tobacco Never  [2] No Known Allergies  Elsie JONETTA Hummer, PA-C 05/30/24 1724

## 2024-06-01 NOTE — ED Triage Notes (Addendum)
 Pt presents with left flank pain x 1 week. Pt seen twice at [facility] for same in the last 7 days. Pt reports N/V. Pt reports she is 4-[redacted] wks pregnant.

## 2024-06-01 NOTE — ED Provider Notes (Signed)
 [facility] Emergency Department Emergency Department Provider Note  Provider at Bedside:  06/01/2024 7:57 AM  Chief Complaint: Abdominal pain  History of Present Illness:  History obtained from: Patient  Veronica Pittman is a 23 y.o. female with PMHx of cannabis hyperemesis syndrome who presents to the ED with complaints of left-sided abdominal pain, onset 1 week ago. She reports associated nausea and vomiting. Patient is [id number] and [redacted] weeks pregnant. She endorses history of same symptoms during previous pregnancies. Patient notes smoking marijuana daily and has a noted history of cannabis hyperemesis syndrome. Per chart review, patient has been seen at an outside ED twice in the past 3 days with progression of her pregnancy and normal consistent with dates. Of note, patient underwent cholecystectomy on 12/21/2023 performed by Prentice Tanda Pizza, MD.Patient denies any other pain or discomfort. Patient denies fever, chills, diaphoresis, diarrhea, dysuria, hematuria, vaginal bleeding, vaginal pain, vaginal discharge, shortness of breath, chest pain, headache, dizziness, numbness, and weakness. Patient denies any other medical complaints at this time.   ______________________ ROS: Pertinent positives and negatives per HPI. Pertinent past medical, surgical, social and family history records were reviewed. Current Medications and Allergies were reviewed.  Physical Exam   Vitals:   06/01/24 0726 06/01/24 0729 06/01/24 0855 06/01/24 0900  BP: 132/85   119/79  BP Location: Right arm     Patient Position: Sitting     Pulse: 88   68  Resp: 15  12   Temp: 98.5 F (36.9 C)     TempSrc: Oral     SpO2:  100%  99%  Weight: 52.8 kg (116 lb 6.4 oz)     Height: 157.5 cm (5' 2)         Physical Exam Vitals and nursing note reviewed.  Constitutional:      General: She is not in acute distress.    Appearance: Normal appearance.  HENT:     Head: Normocephalic and atraumatic.      Comments: Mucous membranes are dry    Right Ear: External ear normal.     Left Ear: External ear normal.     Nose: Nose normal.   Eyes:     Pupils: Pupils are equal, round, and reactive to light.    Cardiovascular:     Rate and Rhythm: Normal rate and regular rhythm.     Heart sounds: Normal heart sounds.  Pulmonary:     Effort: Pulmonary effort is normal.     Breath sounds: Normal breath sounds.  Abdominal:     Palpations: Abdomen is soft.     Tenderness: There is abdominal tenderness in the left lower quadrant.   Musculoskeletal:        General: Normal range of motion.     Cervical back: Normal range of motion.   Skin:    General: Skin is warm.   Neurological:     General: No focal deficit present.     Mental Status: She is alert and oriented to person, place, and time.   Psychiatric:        Mood and Affect: Mood normal.        Behavior: Behavior normal.        Thought Content: Thought content normal.     Results   EKG Impression:  My Interpretation: None performed  Labs: Lab Results (last 24 hours)     Procedure Component Value Ref Range Date/Time   CBC with Differential [[id number]]  (Abnormal) Collected: 06/01/24 0905   Lab  Status: Final result Specimen: Blood from Venous Updated: 06/01/24 0925   Narrative:     The following orders were created for panel order CBC with Differential. Procedure                               Abnormality         Status                    ---------                               -----------         ------                    CBC with [id number]]       Abnormal            Final result               Please view results for these tests on the individual orders.   Comprehensive Metabolic Panel [[id number]]  (Abnormal) Collected: 06/01/24 0905   Lab Status: Final result Specimen: Blood from Venous Updated: 06/01/24 0944    Sodium 135* 136 - 145 mmol/L     Potassium 2.6* 3.4 - 4.5 mmol/L     Chloride 97* 98 - 107  mmol/L     CO2 28 21 - 31 mmol/L     Anion Gap 10 6 - 14 mmol/L     Glucose, Random 94 70 - 99 mg/dL     Blood Urea Nitrogen (BUN) 12 7 - 25 mg/dL     Creatinine 9.38 9.39 - 1.20 mg/dL     eGFR >09 >40 fO/fpw/8.26f7     Comment: GFR estimated by CKD-EPI equations(NKF 2021).   Recommend confirmation of Cr-based eGFR by using Cys-based eGFR and other filtration markers (if applicable) in complex cases and clinical decision-making, as needed.      Albumin 4.2 3.5 - 5.7 g/dL     Total Protein 6.7 6.4 - 8.9 g/dL     Bilirubin, Total 1.9* 0.3 - 1.0 mg/dL     Alkaline Phosphatase (ALP) 55 34 - 104 U/L     Aspartate Aminotransferase (AST) 35 13 - 39 U/L     Alanine Aminotransferase (ALT) 57* 7 - 52 U/L     Calcium 8.4* 8.6 - 10.3 mg/dL     BUN/Creatinine Ratio --    Comment: Creatinine is normal, ratio is not clinically indicated.      Human Chorionic Gonadotropin  (HCG), Quantitative [[id number]]  (Abnormal) Collected: 06/01/24 0905   Lab Status: Final result Specimen: Blood from Venous Updated: 06/01/24 1012    Human Chorionic Gonadotropin  (HCG), Quantitative 135,903* <5 mIU/mL     Comment: Non-pregnant women at reproductive age and female (any age): <5 mIU/mL     CBC with Differential [[id number]]  (Abnormal) Collected: 06/01/24 0905   Lab Status: Final result Specimen: Blood from Venous Updated: 06/01/24 0925    WBC 6.46 4.40 - 11.00 10*3/uL     RBC 4.74 4.10 - 5.10 10*6/uL     Hemoglobin 13.1 12.3 - 15.3 g/dL     Hematocrit 61.7 64.0 - 44.6 %     Mean Corpuscular Volume (MCV) 80.5 80.0 - 96.0 fL     Mean Corpuscular Hemoglobin (MCH) 27.7 27.5 - 33.2 pg     Mean Corpuscular Hemoglobin  Conc (MCHC) 34.4 33.0 - 37.0 g/dL     Red Cell Distribution Width (RDW) 17.2* 12.3 - 17.0 %     Platelet Count (PLT) 169 150 - 450 10*3/uL     Mean Platelet Volume (MPV) 9.1 6.8 - 10.2 fL     Neutrophils % 60 %     Lymphocytes % 25 %     Monocytes % 15 %     Eosinophils % 0 %     Basophils % 0 %      Neutrophils Absolute 3.90 1.80 - 7.80 10*3/uL     Lymphocytes # 1.60 1.00 - 4.80 10*3/uL     Monocytes # 1.00* 0.00 - 0.80 10*3/uL     Eosinophils # 0.00 0.00 - 0.50 10*3/uL     Basophils # 0.00 0.00 - 0.20 10*3/uL    Urinalysis with Reflex to Microscopic [[id number]]  (Abnormal) Collected: 06/01/24 0747   Lab Status: Final result Specimen: Urine from Clean Catch Updated: 06/01/24 0810    Color, Urine Yellow Yellow     Clarity, Urine Cloudy* Clear     Specific Gravity, Urine 1.031* 1.005 - 1.025     pH, Urine 6.5 5.0 - 8.0     Protein, Urine 70* Negative, 10 , 20  mg/dL     Glucose, Urine Negative Negative, 30 , 50  mg/dL     Ketones, Urine 60* Negative, Trace mg/dL     Bilirubin, Urine 1+* Negative     Blood, Urine Negative Negative, Trace     Nitrite, Urine Negative Negative     Leukocyte Esterase, Urine 75* Negative, 25     Urobilinogen, Urine 12.0* <2.0 mg/dL     WBC, Urine 3-87* <6 /HPF     RBC, Urine 6-10* 0 - 2 /HPF     Bacteria, Urine Occasional* None Seen, Rare /HPF     Squamous Epithelial Cells, Urine >30* 0 - 5 /HPF        Patient's sodium was 135 with a potassium of 2.6 and chlorides of 97 her white blood cell count was normal, and then her urinalysis showed a specific gravity of 1.031 with protein and ketones and blood and she had 75 leukocytes with 6-12 WBCs per high-power field and occasional bacteria.  CXR Impression: (Interpreted by me) None performed.  Impression of additional imaging studies include: None performed, patient had 2 recent pelvic ultrasounds.  Imaging: Radiology Results (last 72 hours)     Procedure Component Value Units Date/Time   US  Ob < 14 Wks Transabdominal Only [[id number]] Collected: 05/30/24 1116   Order Status: Completed Updated: 06/01/24 0720   Narrative:     TECHNIQUE:  Present measuring 3.2 mm OB < 14 WKS TRANSABDOMINAL ONLY - real-time grayscale and M-mode Doppler imaging were used to examine the uterus read   COMPARISON:   05/28/2024  INDICATION: Abdominal Pain. Beta-hCG 13,479  Exam date/time:  05/30/2024 9:51 AM  FINDINGS:  #  Single intrauterine pregnancy is again noted with occasional gestational sac central upper uterine cavity suggesting reaction eccentric posteriorly. Fetal pole, with a crown-rump measurement 1 cm consistent with 7 weeks 2 days gestation (+/- 6 days.) Fetal heart activity is noted with the radial 149 beats. #  Similar hypoechoic margin around the lower aspect of gestational sac, unchanged. #  Right ovary measures 3.4 x 1.6 x 2.5 cm grossly unchanged from previous study. #  Left measures 5.3 x 3.5 x 3 cm and contains a low density area measuring 3 x 2 x  2.5 cm, which may be a collapsing corpus luteal cyst.   Impression:     IMPRESSION: 1.  Expected increase in size fetal pole, and increased beta-hCG, consistent with progressing intrauterine pregnancy. 2.  Stable hypoechoic margin around the lower end of the implantation. No large subchorionic hemorrhage.  Electronically Signed by: Franky Cheshire MD, PHD on 05/30/2024 11:31 AM        Procedures   Procedures  Evidence Based Calculators      ED Course   ED Course as of 06/01/24 1209  Fri Jun 01, 2024  0824 Patient's initial vital signs are stable. [LT]  [id number] Intervention today as patient is getting quantitative beta analysis, urinalysis, CBC and CMP, and we will give her IV fluids and IV pain and nausea medicine at this time for her abdominal pain and vomiting.  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  She had an ultrasound and quantitative beta's that were consistent with dates. [LT]  0949 Patient's potassium was 2.6 will chlorides of 97.  Patient will be given 40 mill equivalents of potassium p.o. if she can hold it down and we will give her IV runs of potassium, with consultation with OB/GYN to see if they feel the patient  needs to be admitted for hydration, vomiting control, and the hypokalemia. [LT]  [id number] Patient states she does not have an OBGYN at this time.  [AW]  1007 06/01/2024 10:07 AM Case discussed with Cesar Essex, DO with OBGYN who will come see the patient for admission  [AW]  1047 Patient's urinalysis also showed 75 leukocytes with 6-12 WBCs per high-power field and occasional bacteria so she may also have an early UTI as well.  She did have ketones with a specific gravity of 1.031 as well consistent with her dehydration. [LT]  1202 Disposition-admit.  Patient was admitted by Dr. 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 [LT]  1202 Disposition - admit [LT]    ED Course User Index [AW] Lavanda Pang, Scribe [LT] Rock Dannielle Birmingham, MD    Medical Decision Making   External records were reviewed: Reviewed note from outside ED visit on 05/30/2024 which showed patient was seen for left-sided abdominal pain. [id number]. QuaNT was [id number]. Reviewed ultrasound results from 05/30/2024 which showed Expected increase in size fetal pole, and increased beta-hCG, consistent with progressing intrauterine pregnancy.  _________________________  Osa Ellen is a 23 y.o. female who presents to the ED with complaints of left-sided abdominal pain, onset 1 week ago. She reports associated nausea and vomiting. Patient is [id number] and [redacted] weeks pregnant. She endorses history of same symptoms during previous pregnancies. Patient notes smoking marijuana daily and has a noted history of cannabis hyperemesis syndrome.  Patient has had 2 ultrasounds at [facility] with progression of her pregnancy and normal consistent with dates. On my initial evaluation, patient had some mild tenderness in her left lower quadrant where it has been previously, and she states that she has been vomiting all  night.  The following differentials were considered: Placenta previa, placental abruption, threatened abortion, incomplete abortion, complete abortion, ectopic pregnancy, pregnancy pain, UTI, ruptured membranes, Gastritis, GERD, peptic ulcer disease, cholecystitis, colitis, gastroenteritis, appendicitis, small bowel obstruction, urinary tract infection, mesenteric ischemia   Pertinent studies were obtained, with results listed in chart above.  results interpreted as above.  Based on  ED workup, findings are consistent with acute abdominal pain, nausea and vomiting, hyperemesis gravidarum, cannabinoid hyperemesis syndrome.    Patient received IV fluids and IV potassium for her hypokalemia and dehydration, and we we will allow Dr. Cesar Essex to decipher if he is going to treat her UTI but we will send a urine culture.  On reevaluation, symptoms are improved.  Clinical Assessment/Plan: Patient presented to the ER with multiple days of vomiting, and was found to be hypokalemic and dehydrated.  Patient had been seen several times in the last week and had had several ultrasounds which showed that she had a developing normal fetus and her quantitative beta does have been going up appropriately.  Patient was thought to be hyperemesis gravidarum but there was also question if she also had associated cannabinoid cyclic vomiting.  Or whether combination of the 2 together were making her more sick.  Patient was given IV fluids and IV runs of potassium in the ER, and Dr. Cesar Essex was consulted and he is going to admit the patient for electrolyte imbalance and dehydration.  Again we will send a urine culture to see if patient has a UTI.  Discussion of management or test interpretation with external provider(s): Dr. Cesar Essex from OB/GYN was consulted   Clinical Complexity/Risk   Patient's presentation is most consistent with acute presentation with potential threat to life or bodily function.  Patient's  impaired access to primary care increases the complexity of managing their  presentation with abdominal pain and vomiting.    Pt presentation is complicated by their history of hyperemesis gravidarum, cannabis hyperemesis syndrome, and history of miscarriage, resulting in increased risk of frequent ED usage  Provider time spent in patient care today, inclusive of but not limited to clinical reassessment, review of diagnostic studies, and discharge preparation, was greater than 30 minutes.  OTC medications: no, patient admitted Prescription medications discussed: no, patient admitted  ED Clinical Impression   Diagnoses that have been ruled out:  None  Diagnoses that are still under consideration:  None  Final diagnoses:  Acute abdominal pain  Nausea and vomiting, unspecified vomiting type  Hyperemesis gravidarum (CMD)  Cannabinoid hyperemesis syndrome  Hypokalemia  Acute UTI    ED Assessment/Plan   ED Disposition     ED Disposition  Admit   Condition  --   Comment  Primary Provider Group: Yes [1]  Provider Group:: [facility]  (Obstetrics and Gynecology) [[id number]]  Certification: I certify that inpatient services are medically necessary for this patient for a duration of greater than two midnights. See H&P and MD Progress Notes for additional information about the patient's course of treatment.          DISCHARGE MEDICATIONS   Medication List     ASK your doctor about these medications    cephALEXin 500 mg capsule Commonly known as: KEFLEX Take 500 mg by mouth 3 (three) times a day.   docusate sodium  100 mg capsule Commonly known as: COLACE Take 1 capsule (100 mg total) by mouth 2 (two) times a day as needed for constipation.   famotidine  20 mg tablet Commonly known as: PEPCID  Take 20 mg by mouth 2 (two) times a day.   HYDROcodone-acetaminophen  5-325 mg per tablet Commonly known as: NORCO Take 1 tablet by mouth every 6 (six) hours as needed for  moderate pain (4-6).   ibuprofen 800 mg tablet Commonly known as: MOTRIN Take 1 tablet (800 mg total) by mouth every 6 (six) hours  as needed for mild pain (1-3).   metoclopramide  10 mg tablet Commonly known as: REGLAN  Take 10 mg by mouth every 6 (six) hours as needed.   omeprazole 20 mg DR capsule Commonly known as: PriLOSEC Take 20 mg by mouth 2 (two) times a day before meals.   potassium chloride  20 mEq ER tablet Take 1 tablet (20 mEq total) by mouth 2 (two) times a day for 10 days.   prenatal vitamin-iron-folic acid tablet Take 1 tablet by mouth daily.   pyridoxine  25 mg tablet Commonly known as: VITAMIN B6 Take 1 tablet (25 mg total) by mouth 3 times daily.   Sleep Aid (doxylamine ) 25 mg tablet Generic drug: doxylamine  Take 12.5 mg by mouth nightly.        FOLLOW UP No follow-up provider specified.  ____________________________ Scribe's Attestation: This document serves as a record of services personally performed by Rock Birmingham, MD. It was created on their behalf by Lavanda Pang, Scribe, a trained medical scribe. The creation of this record is the provider's dictation and/or activities during the visit.   Electronically signed by: Rock Dannielle Birmingham, MD 06/01/2024 7:37 AM   *Some images could not be shown.

## 2024-06-12 NOTE — Progress Notes (Signed)
 CC: Hyperemesis   Subjective:    HPI: Veronica Pittman is a 23 y.o. [id number] who presents today for follow-up with hyperemesis.  She has struggled with this with each of her pregnancies.  She was recently admitted and discharged on scopolamine , Pepcid , Reglan , Zofran  as needed.  She is using the scopolamine  patches regularly as well as the Pepcid .  She takes the other medications as needed.  She does report that nausea/vomiting is better than her previous pregnancy though she is still having some.  She does tolerate some p.o. intake throughout the day.  She has no other concerns today.  Past obstetric, gynecologic, medical, surgical, family, and social history reviewed and updated.    Allergies[1]  Current Medications[2]   Review Of System:  General: no fever, fatigue, weight or appetite changes Skin: no rashes, lesions, itching, mole change HEENT: no frequent headaches, hearing changes, sore throat, dental problems, sinus problems   Lymph: no tender or swollen lymph nodes  Breasts: no lumps, nipple discharge, breast pain Resp: no SOB, cough, wheezing Cardio: no chest pain, palpitations, edema GI: no D, constipation, anorexia, bloating, reflux, blood in stool, leakage of stool  GU: no frequency, dysuria, flank pain, hematuria, incontinence, retention       GYN: no change in discharge or odor, itching, inter-menstrual or post-coital bleeding, dyspareunia  Musk: no weakness, arthralgia, joint swelling, ROM limitations Endocrine: no polydypsia, polyphagia, polyuria, heat/cold intolerance, hot flashes, dry skin Psych: no depression, anxiety, panic attacks  __________________________________________________________________  Objective:   PHYSICAL EXAM:  Vitals:  BP 118/79   Pulse (!) 121   Wt 50.8 kg (112 lb)   LMP 11/25/2023 (Approximate)  BP 118/79   Pulse (!) 121   Wt 50.8 kg (112 lb)   LMP 11/25/2023 (Approximate)   Breastfeeding Yes   BMI 20.49 kg/m   Constitutional:  Well-developed, well-nourished female in no acute distress HEENT: normocephalic, atraumatic  Respiratory: nonlabored breathing  Cardiovascular: acyanotic Abdomen: soft, NT, ND Extremities:  No calf pain, no edema GU: See ultrasound report      Assessment/Plan:  This is a 23 y.o. [id number] who presents with   1. [redacted] weeks gestation of pregnancy (CMD) (Primary) -Ultrasound today reviewed with patient, positive fetal heart rate - POC Glucose and Protein, Urine - OB precautions reviewed  2. Hyperemesis - Reviewed continued medications as needed - Repeat labs today to ensure no further need for electrolyte replacement - Comprehensive Metabolic Panel - Magnesium; Future   RTC for new OB visit  Wells Nat Evens, MD      [1] No Known Allergies [2]  Current Outpatient Medications:  .  metoclopramide  (REGLAN ) 10 mg tablet, Take 10 mg by mouth every 6 (six) hours as needed., Disp: , Rfl:  .  potassium chloride  (KLOR-CON ) 10 mEq ER tablet, Take 1 tablet (10 mEq total) by mouth daily for 10 days., Disp: 10 tablet, Rfl: 0 .  scopolamine  (TRANSDERM-SCOP) 1 mg over 3 days pt3d patch, Apply 1 patch topically every third day., Disp: 30 patch, Rfl: 0 .  docusate sodium  (COLACE) 100 mg capsule, Take 1 capsule (100 mg total) by mouth 2 (two) times a day as needed for constipation., Disp: 60 capsule, Rfl: 0 .  doxylamine -pyridoxine  (DICLEGIS) 10-10 mg TbEC per DR tablet, Take 2 tablets by mouth daily. (Patient not taking: Reported on 06/12/2024), Disp: 180 tablet, Rfl: 0 .  famotidine  (PEPCID ) 20 mg tablet, Take 20 mg by mouth 2 (two) times a day. (Patient not taking: Reported on 06/12/2024), Disp: ,  Rfl:  .  prenatal vits96/iron fum/folic (prenatal vitamin-iron-folic acid) tablet, Take 1 tablet by mouth daily., Disp: , Rfl:

## 2024-06-15 ENCOUNTER — Inpatient Hospital Stay (HOSPITAL_COMMUNITY)
Admission: AD | Admit: 2024-06-15 | Discharge: 2024-06-15 | Disposition: A | Discharge status: Home / Self Care | Attending: Family Medicine | Admitting: Family Medicine

## 2024-06-15 ENCOUNTER — Encounter (HOSPITAL_COMMUNITY): Payer: Self-pay | Admitting: Obstetrics & Gynecology

## 2024-06-15 ENCOUNTER — Inpatient Hospital Stay (HOSPITAL_COMMUNITY)

## 2024-06-15 DIAGNOSIS — O468X1 Other antepartum hemorrhage, first trimester: Secondary | ICD-10-CM | POA: Diagnosis not present

## 2024-06-15 DIAGNOSIS — O418X10 Other specified disorders of amniotic fluid and membranes, first trimester, not applicable or unspecified: Secondary | ICD-10-CM | POA: Insufficient documentation

## 2024-06-15 DIAGNOSIS — O99611 Diseases of the digestive system complicating pregnancy, first trimester: Secondary | ICD-10-CM | POA: Insufficient documentation

## 2024-06-15 DIAGNOSIS — O219 Vomiting of pregnancy, unspecified: Secondary | ICD-10-CM | POA: Diagnosis present

## 2024-06-15 DIAGNOSIS — Z3A09 9 weeks gestation of pregnancy: Secondary | ICD-10-CM | POA: Diagnosis not present

## 2024-06-15 DIAGNOSIS — K5900 Constipation, unspecified: Secondary | ICD-10-CM | POA: Diagnosis present

## 2024-06-15 DIAGNOSIS — O210 Mild hyperemesis gravidarum: Secondary | ICD-10-CM

## 2024-06-15 DIAGNOSIS — O26891 Other specified pregnancy related conditions, first trimester: Secondary | ICD-10-CM | POA: Diagnosis not present

## 2024-06-15 DIAGNOSIS — O26899 Other specified pregnancy related conditions, unspecified trimester: Secondary | ICD-10-CM

## 2024-06-15 DIAGNOSIS — R1032 Left lower quadrant pain: Secondary | ICD-10-CM | POA: Insufficient documentation

## 2024-06-15 DIAGNOSIS — Z3490 Encounter for supervision of normal pregnancy, unspecified, unspecified trimester: Secondary | ICD-10-CM

## 2024-06-15 HISTORY — DX: Unspecified ovarian cyst, unspecified side: N83.209

## 2024-06-15 LAB — URINALYSIS, ROUTINE W REFLEX MICROSCOPIC
Bilirubin Urine: NEGATIVE
Glucose, UA: NEGATIVE mg/dL
Hgb urine dipstick: NEGATIVE
Ketones, ur: 20 mg/dL — AB
Nitrite: NEGATIVE
Protein, ur: 30 mg/dL — AB
Specific Gravity, Urine: 1.024 (ref 1.005–1.030)
pH: 6 (ref 5.0–8.0)

## 2024-06-15 MED ORDER — LACTATED RINGERS IV BOLUS
1000.0000 mL | Freq: Once | INTRAVENOUS | Status: AC
  Administered 2024-06-15: 1000 mL via INTRAVENOUS

## 2024-06-15 MED ORDER — SODIUM CHLORIDE 0.9 % IV SOLN
25.0000 mg | Freq: Once | INTRAVENOUS | Status: AC
  Administered 2024-06-15: 25 mg via INTRAVENOUS
  Filled 2024-06-15: qty 1

## 2024-06-15 MED ORDER — ONDANSETRON 8 MG PO TBDP
8.0000 mg | ORAL_TABLET | Freq: Three times a day (TID) | ORAL | 0 refills | Status: DC | PRN
Start: 2024-06-15 — End: 2024-06-15

## 2024-06-15 MED ORDER — FAMOTIDINE IN NACL 20-0.9 MG/50ML-% IV SOLN
20.0000 mg | Freq: Once | INTRAVENOUS | Status: AC
  Administered 2024-06-15: 20 mg via INTRAVENOUS
  Filled 2024-06-15: qty 50

## 2024-06-15 MED ORDER — SCOPOLAMINE 1 MG/3DAYS TD PT72SCOPOLAMINE 1 MG/3DAYS
1.0000 | MEDICATED_PATCH | Freq: Once | TRANSDERMAL | Status: DC
Start: 2024-06-15 — End: 2024-06-15
  Filled 2024-06-15: qty 1

## 2024-06-15 MED ORDER — PROMETHAZINE HCL 25 MG PO TABS: 25.0000 mg | ORAL_TABLET | Freq: Every evening | ORAL | 2 refills | Status: AC | PRN

## 2024-06-15 NOTE — MAU Note (Signed)
 Veronica Pittman is a 23 y.o. at [redacted]w[redacted]d here in [facility]U reporting: threw up all last night and this morning.  Hurting in LLQ, been hurting, but was worse last night.  No bleeding. Has been constipated, last bm was a wk ago.  Taking Reglan . Onset of complaint: ongoing pain Pain score: 7 Vitals:   06/15/24 1028  BP: 115/84  Pulse: 99  Resp: 16  Temp: 98.2 F (36.8 C)  SpO2: 100%     Lab orders placed from triage:  urine

## 2024-06-15 NOTE — MAU Provider Note (Signed)
 History     CSN: [id number]  Arrival date and time: 06/15/24 1004   Event Date/Time   First Provider Initiated Contact with Patient 06/15/24 1656      Chief Complaint  Patient presents with   Emesis   Abdominal Pain   HPI Ms. Veronica Pittman is a 23 y.o. year old [id number] female at [redacted]w[redacted]d weeks gestation who presents to MAU reporting vomiting last night and this morning. She takes Reglan  for vomiting. She also reports pain in the LLQ that worsened last night. Pain rated 7/10. She reports constipation; last BM was 1 week ago. She denies any VB. She receives [facility] with [facility] provider; next appt is 07/19/2024.   OB History     Gravida  4   Para  2   Term  2   Preterm      AB  1   Living  2      SAB  1   IAB      Ectopic      Multiple      Live Births  2           Past Medical History:  Diagnosis Date   Medical history non-contributory    Ovarian cyst     Past Surgical History:  Procedure Laterality Date   CHOLECYSTECTOMY  2025    Family History  Problem Relation Age of Onset   Healthy Mother    Healthy Father     Social History   Tobacco Use   Smoking status: Never   Smokeless tobacco: Never  Vaping Use   Vaping status: Never Used  Substance Use Topics   Alcohol use: No   Drug use: Not Currently    Types: Marijuana    Comment: stopped around 5 wks preg    Allergies: No Known Allergies  Medications Prior to Admission  Medication Sig Dispense Refill Last Dose/Taking   metoCLOPramide  (REGLAN ) 10 MG tablet Take 10 mg by mouth 4 (four) times daily.   06/14/2024 Evening   polyethylene glycol powder (GLYCOLAX /MIRALAX ) 17 GM/SCOOP powder Take 17 g by mouth daily as needed. 510 g 1    promethazine  (PHENERGAN ) 12.5 MG tablet Take 1 tablet (12.5 mg total) by mouth every 6 (six) hours as needed for nausea or vomiting. 30 tablet 0     Review of Systems  Constitutional: Negative.   HENT: Negative.    Eyes: Negative.   Respiratory:  Negative.    Cardiovascular: Negative.   Gastrointestinal:  Positive for nausea and vomiting.  Endocrine: Negative.   Genitourinary:  Positive for pelvic pain.  Musculoskeletal: Negative.   Skin: Negative.   Allergic/Immunologic: Negative.   Neurological: Negative.   Hematological: Negative.   Psychiatric/Behavioral: Negative.     Physical Exam   Blood pressure 120/78, pulse 82, temperature 98.2 F (36.8 C), temperature source Oral, resp. rate 16, height 5' 2 (1.575 m), weight 50.5 kg, SpO2 100%, unknown if currently breastfeeding.  Physical Exam Vitals and nursing note reviewed.  Constitutional:      Appearance: Normal appearance. She is normal weight.  Abdominal:     General: Abdomen is flat.     Palpations: Abdomen is soft.  Musculoskeletal:        General: Normal range of motion.  Skin:    General: Skin is warm and dry.  Neurological:     Mental Status: She is alert and oriented to person, place, and time.  Psychiatric:        Mood and  Affect: Mood normal.        Behavior: Behavior normal.        Thought Content: Thought content normal.        Judgment: Judgment normal.     MAU Course  Procedures  MDM CCUA UPT OB U/S < 14 wks TVUS IVFs: Phenergan  25 mg in LR 200 ml @ 999 ml/hr  -- resolved nausea/vomiting Scopolamine  Patch x 1 Pepcid  20 mg IVPB PO Challenge -- patient tolerated well   Results for orders placed or performed during the hospital encounter of 06/15/24 (from the past 24 hours)  Urinalysis, Routine w reflex microscopic -Urine, Clean Catch     Status: Abnormal   Collection Time: 06/15/24 10:53 AM  Result Value Ref Range   Color, Urine AMBER (A) YELLOW   APPearance CLOUDY (A) CLEAR   Specific Gravity, Urine 1.024 1.005 - 1.030   pH 6.0 5.0 - 8.0   Glucose, UA NEGATIVE NEGATIVE mg/dL   Hgb urine dipstick NEGATIVE NEGATIVE   Bilirubin Urine NEGATIVE NEGATIVE   Ketones, ur 20 (A) NEGATIVE mg/dL   Protein, ur 30 (A) NEGATIVE mg/dL   Nitrite  NEGATIVE NEGATIVE   Leukocytes,Ua TRACE (A) NEGATIVE   RBC / HPF 0-5 0 - 5 RBC/hpf   WBC, UA 6-10 0 - 5 WBC/hpf   Bacteria, UA MANY (A) NONE SEEN   Squamous Epithelial / HPF 11-20 0 - 5 /HPF   Mucus PRESENT     US  OB LESS THAN 14 WEEKS WITH OB TRANSVAGINAL Result Date: 06/15/2024 CLINICAL DATA:  [id number] Abdominal pain during pregnancy in first trimester [id number] EXAM: OBSTETRIC <14 WK US  AND TRANSVAGINAL OB US  TECHNIQUE: Both transabdominal and transvaginal ultrasound examinations were performed for complete evaluation of the gestation as well as the maternal uterus, adnexal regions, and pelvic cul-de-sac. Transvaginal technique was performed to assess early pregnancy. COMPARISON:  March 05, 2024 FINDINGS: Intrauterine gestational sac: Single Yolk sac:  Present Fetal Pole:  Present Cardiac Activity: Present Heart Rate: 172 bpm CRL: 29.4 mm 9w 5d                US  EDC: 01/13/2025 Subchorionic hemorrhage: Small subchronic hematoma measuring 1 x 1 x 1.2 cm. Maternal uterus/adnexae: Likely corpus luteum in the right ovary measuring 2.7 cm. No free pelvic fluid. IMPRESSION: 1. Single, live intrauterine gestation with an estimated gestational age of [redacted] weeks, 5 days. Fetal heart rate of 172 beats per minute. 2. Small subchronic hematoma, measuring 1 x 1 x 1.2 cm. Close attention on routine sonographic follow-up recommended. Electronically Signed   By: Rogelia Myers M.D.   On: 06/15/2024 17:22     *Consult with Dr. Fredirick @ [phone] - notified of patient's complaints, assessments, lab & U/S results, tx plan d/c home, keep scheduled appt with OB - ok to d/c home, agrees with plan  Assessment and Plan  1. Morning sickness (Primary) - Continue taking anti-emetics as previously prescribed  2. Subchorionic hematoma in first trimester, single or unspecified fetus - Information provided on [facility]   3. Abdominal pain affecting pregnancy - Information provided on abdominal pain in pregnancy   4. Intrauterine  pregnancy - Continue PNC as scheduled  5. [redacted] weeks gestation of pregnancy  6. Constipation during pregnancy, first trimester   - Discharge home - Keep scheduled appt with [facility] provider - Patient verbalized an understanding of the plan of care and agrees.   Ellar Hakala, CNM 06/15/2024,6:00 PM
